# Patient Record
Sex: Female | Born: 1990 | Race: Black or African American | Hispanic: No | Marital: Single | Smoking: Never smoker
Health system: Southern US, Community
[De-identification: ages and names within clinical notes are randomized; demographics above are authoritative.]

## PROBLEM LIST (undated history)

## (undated) ENCOUNTER — Inpatient Hospital Stay (HOSPITAL_COMMUNITY): Payer: Self-pay

## (undated) DIAGNOSIS — B999 Unspecified infectious disease: Secondary | ICD-10-CM

## (undated) DIAGNOSIS — F3181 Bipolar II disorder: Secondary | ICD-10-CM

## (undated) DIAGNOSIS — B009 Herpesviral infection, unspecified: Secondary | ICD-10-CM

## (undated) DIAGNOSIS — F329 Major depressive disorder, single episode, unspecified: Secondary | ICD-10-CM

## (undated) DIAGNOSIS — K219 Gastro-esophageal reflux disease without esophagitis: Secondary | ICD-10-CM

## (undated) DIAGNOSIS — F32A Depression, unspecified: Secondary | ICD-10-CM

---

## 2011-10-17 HISTORY — PX: BUNIONECTOMY: SHX129

## 2014-01-17 ENCOUNTER — Encounter (HOSPITAL_COMMUNITY): Payer: Self-pay | Admitting: Emergency Medicine

## 2014-01-17 ENCOUNTER — Emergency Department (HOSPITAL_COMMUNITY)
Admission: EM | Admit: 2014-01-17 | Discharge: 2014-01-17 | Disposition: A | Discharge status: Home / Self Care | Payer: No Typology Code available for payment source | Attending: Emergency Medicine | Admitting: Emergency Medicine

## 2014-01-17 DIAGNOSIS — Z8659 Personal history of other mental and behavioral disorders: Secondary | ICD-10-CM | POA: Insufficient documentation

## 2014-01-17 DIAGNOSIS — T7421XA Adult sexual abuse, confirmed, initial encounter: Secondary | ICD-10-CM | POA: Insufficient documentation

## 2014-01-17 DIAGNOSIS — Z3202 Encounter for pregnancy test, result negative: Secondary | ICD-10-CM | POA: Insufficient documentation

## 2014-01-17 DIAGNOSIS — K219 Gastro-esophageal reflux disease without esophagitis: Secondary | ICD-10-CM | POA: Insufficient documentation

## 2014-01-17 DIAGNOSIS — IMO0002 Reserved for concepts with insufficient information to code with codable children: Secondary | ICD-10-CM

## 2014-01-17 DIAGNOSIS — Z79899 Other long term (current) drug therapy: Secondary | ICD-10-CM | POA: Insufficient documentation

## 2014-01-17 HISTORY — DX: Bipolar II disorder: F31.81

## 2014-01-17 HISTORY — DX: Gastro-esophageal reflux disease without esophagitis: K21.9

## 2014-01-17 LAB — WET PREP, GENITAL
CLUE CELLS WET PREP: NONE SEEN
Trich, Wet Prep: NONE SEEN
WBC WET PREP: NONE SEEN
YEAST WET PREP: NONE SEEN

## 2014-01-17 LAB — POC URINE PREG, ED: Preg Test, Ur: NEGATIVE

## 2014-01-17 MED ORDER — METRONIDAZOLE 500 MG PO TABS
ORAL_TABLET | ORAL | Status: AC
  Administered 2014-01-17: 05:00:00
  Filled 2014-01-17: qty 4

## 2014-01-17 MED ORDER — PROMETHAZINE HCL 25 MG PO TABS
ORAL_TABLET | ORAL | Status: AC
  Administered 2014-01-17: 05:00:00
  Filled 2014-01-17: qty 3

## 2014-01-17 MED ORDER — CEFIXIME 400 MG PO TABS
ORAL_TABLET | ORAL | Status: AC
  Administered 2014-01-17: 05:00:00
  Filled 2014-01-17: qty 1

## 2014-01-17 MED ORDER — ULIPRISTAL ACETATE 30 MG PO TABS
ORAL_TABLET | ORAL | Status: AC
  Administered 2014-01-17: 05:00:00
  Filled 2014-01-17: qty 1

## 2014-01-17 MED ORDER — AZITHROMYCIN 1 G PO PACK
PACK | ORAL | Status: AC
  Administered 2014-01-17: 05:00:00
  Filled 2014-01-17: qty 1
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
## 2014-01-17 NOTE — ED Notes (Signed)
Bed: WA17 Expected date:  Expected time:  Means of arrival:  Comments: SANE

## 2014-01-17 NOTE — ED Notes (Signed)
Medications were given to patient to take home because she had not eaten anything in several hours.
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
## 2014-01-21 LAB — GC/CHLAMYDIA PROBE AMP
CT Probe RNA: POSITIVE — AB
GC PROBE AMP APTIMA: NEGATIVE

## 2014-01-22 ENCOUNTER — Telehealth (HOSPITAL_BASED_OUTPATIENT_CLINIC_OR_DEPARTMENT_OTHER): Payer: Self-pay | Admitting: Emergency Medicine

## 2014-01-22 NOTE — Telephone Encounter (Signed)
+   Chlamydia , was treated prophylactically in ED on 01/17/14

## 2014-02-26 ENCOUNTER — Emergency Department (HOSPITAL_COMMUNITY)
Admission: EM | Admit: 2014-02-26 | Discharge: 2014-02-26 | Disposition: A | Discharge status: Home / Self Care | Attending: Emergency Medicine | Admitting: Emergency Medicine

## 2014-02-26 ENCOUNTER — Encounter (HOSPITAL_COMMUNITY): Payer: Self-pay | Admitting: *Deleted

## 2014-02-26 DIAGNOSIS — K219 Gastro-esophageal reflux disease without esophagitis: Secondary | ICD-10-CM | POA: Diagnosis not present

## 2014-02-26 DIAGNOSIS — H9203 Otalgia, bilateral: Secondary | ICD-10-CM | POA: Insufficient documentation

## 2014-02-26 DIAGNOSIS — H748X3 Other specified disorders of middle ear and mastoid, bilateral: Secondary | ICD-10-CM | POA: Insufficient documentation

## 2014-02-26 DIAGNOSIS — Z8659 Personal history of other mental and behavioral disorders: Secondary | ICD-10-CM | POA: Insufficient documentation

## 2014-02-26 DIAGNOSIS — Z79899 Other long term (current) drug therapy: Secondary | ICD-10-CM | POA: Insufficient documentation

## 2014-02-26 MED ORDER — NEOMYCIN-POLYMYXIN-HC 3.5-10000-1 OT SUSP
3.0000 [drp] | Freq: Four times a day (QID) | OTIC | Status: DC
  Administered 2014-02-26: 3 [drp] via OTIC
  Filled 2014-02-26: qty 10

## 2014-02-26 NOTE — ED Notes (Signed)

## 2014-02-26 NOTE — Discharge Instructions (Signed)
Ear Drops °You have been diagnosed with a condition requiring you to put drops of medicine into your outer ear. °HOME CARE INSTRUCTIONS  °· Put drops in the affected ear as instructed. After putting the drops in, you will need to lie down with the affected ear facing up for ten minutes so the drops will remain in the ear canal and run down and fill the canal. Continue using the ear drops for as long as directed by your health care provider. °· Prior to getting up, put a cotton ball gently in your ear canal. Leave enough of the cotton ball out so it can be easily removed. Do not attempt to push this down into the canal with a cotton-tipped swab or other instrument. °· Do not irrigate or wash out your ears if you have had a perforated eardrum or mastoid surgery, or unless instructed to do so by your health care provider. °· Keep appointments with your health care provider as instructed. °· Finish all medicine, or use for the length of time prescribed by your health care provider. Continue the drops even if your problem seems to be doing well after a couple days, or continue as instructed. °SEEK MEDICAL CARE IF: °· You become worse or develop increasing pain. °· You notice any unusual drainage from your ear (particularly if the drainage has a bad smell). °· You develop hearing difficulties. °· You experience a serious form of dizziness in which you feel as if the room is spinning, and you feel nauseated (vertigo). °· The outside of your ear becomes red or swollen or both. This may be a sign of an allergic reaction. °MAKE SURE YOU:  °· Understand these instructions. °· Will watch your condition. °· Will get help right away if you are not doing well or get worse. °Document Released: 12/27/2000 Document Revised: 01/07/2013 Document Reviewed: 07/30/2012 °ExitCare® Patient Information ©2015 [employer]. This information is not intended to replace advice given to you by your health care provider. Make sure you discuss any  questions you have with your health care provider. ° °

## 2014-02-26 NOTE — ED Provider Notes (Signed)
CSN: [id number]     Arrival date & time 02/26/14  0154 History   First MD Initiated Contact with Patient 02/26/14 0207     Chief Complaint  Patient presents with  . Otalgia     (Consider location/radiation/quality/duration/timing/severity/associated sxs/prior Treatment) Patient is a 24 y.o. female presenting with ear pain. The history is provided by the patient. No language interpreter was used.  Otalgia Location:  Bilateral Associated symptoms: no congestion, no ear discharge and no fever   Associated symptoms comment:  She started having significant bilateral ear pain shortly after using ear drops to clean ear wax from the ear canals. No fever, and no change in hearing.    Past Medical History  Diagnosis Date  . GERD (gastroesophageal reflux disease)   . Bipolar 2 disorder    Past Surgical History  Procedure Laterality Date  . Bunionectomy Right 10/13   No family history on file. History  Substance Use Topics  . Smoking status: Never Smoker   . Smokeless tobacco: Never Used  . Alcohol Use: Yes     Comment: socially   OB History    No data available     Review of Systems  Constitutional: Negative for fever and chills.  HENT: Positive for ear pain. Negative for congestion and ear discharge.   Neurological: Negative for dizziness.      Allergies  Review of patient's allergies indicates no known allergies.  Home Medications   Prior to Admission medications   Medication Sig Start Date End Date Taking? Authorizing Provider  omeprazole (PRILOSEC) 20 MG capsule Take 20 mg by mouth daily.    Historical Provider, MD   BP 134/63 mmHg  Pulse 60  Temp(Src) 98.3 F (36.8 C) (Oral)  Resp 19  Ht 5\' 4"  (1.626 m)  Wt 180 lb (81.647 kg)  BMI 30.88 kg/m2  SpO2 98%  LMP 02/17/2014 Physical Exam  Constitutional: She appears well-developed and well-nourished. No distress.  HENT:  Head: Normocephalic.  Right Ear: External ear normal.  Left Ear: External ear normal.   TM's appear reddened and  irritated bilaterally with dullness or middle ear effusion.   Lymphadenopathy:    She has no cervical adenopathy.    ED Course  Procedures (including critical care time) Labs Review Labs Reviewed - No data to display  Imaging Review No results found.   EKG Interpretation None      MDM   Final diagnoses:  None    1. Otalgia  Pain is likely secondary to drops used for cleansing the ear. Will provide cortosporin otic and ENT referral if pain continues.     Arnoldo HookerShari A Voshon Petro, PA-C 02/26/14 [id number]  Loren Raceravid Yelverton, MD 02/26/14 [phone]

## 2014-02-26 NOTE — ED Notes (Signed)
Pt reports using debrox ear drops in both ears x 4 hours ago.  Her ears started to hurt right after.  She reports she was able to get cerumen out of her R ear.  Redness noted in her L ear.  Pt reports mild pain in her L ear but worse in her Right.

## 2016-03-31 ENCOUNTER — Other Ambulatory Visit: Payer: Self-pay | Admitting: Orthopedic Surgery

## 2016-03-31 DIAGNOSIS — M25511 Pain in right shoulder: Secondary | ICD-10-CM

## 2016-04-14 ENCOUNTER — Ambulatory Visit
Admission: RE | Admit: 2016-04-14 | Discharge: 2016-04-14 | Disposition: A | Discharge status: Home / Self Care | Source: Ambulatory Visit | Attending: Orthopedic Surgery | Admitting: Orthopedic Surgery

## 2016-04-14 ENCOUNTER — Ambulatory Visit
Admission: RE | Admit: 2016-04-14 | Discharge: 2016-04-14 | Disposition: A | Discharge status: Home / Self Care | Payer: Federal, State, Local not specified - PPO | Source: Ambulatory Visit | Attending: Orthopedic Surgery | Admitting: Orthopedic Surgery

## 2016-04-14 DIAGNOSIS — M25511 Pain in right shoulder: Secondary | ICD-10-CM

## 2016-04-14 MED ORDER — IOPAMIDOL (ISOVUE-M 200) INJECTION 41%: 15.0000 mL | Freq: Once | INTRAMUSCULAR | Status: DC

## 2017-03-06 ENCOUNTER — Encounter (HOSPITAL_COMMUNITY): Payer: Self-pay | Admitting: Emergency Medicine

## 2017-03-06 DIAGNOSIS — R1012 Left upper quadrant pain: Secondary | ICD-10-CM | POA: Insufficient documentation

## 2017-03-06 DIAGNOSIS — R197 Diarrhea, unspecified: Secondary | ICD-10-CM | POA: Diagnosis present

## 2017-03-06 DIAGNOSIS — R110 Nausea: Secondary | ICD-10-CM | POA: Insufficient documentation

## 2017-03-06 LAB — URINALYSIS, ROUTINE W REFLEX MICROSCOPIC
BILIRUBIN URINE: NEGATIVE
Glucose, UA: NEGATIVE mg/dL
Hgb urine dipstick: NEGATIVE
Ketones, ur: 5 mg/dL — AB
Leukocytes, UA: NEGATIVE
NITRITE: NEGATIVE
PROTEIN: NEGATIVE mg/dL
Specific Gravity, Urine: 1.035 — ABNORMAL HIGH (ref 1.005–1.030)
pH: 5 (ref 5.0–8.0)

## 2017-03-06 LAB — COMPREHENSIVE METABOLIC PANEL
ALK PHOS: 59 U/L (ref 38–126)
ALT: 20 U/L (ref 14–54)
AST: 24 U/L (ref 15–41)
Albumin: 3.7 g/dL (ref 3.5–5.0)
Anion gap: 8 (ref 5–15)
BILIRUBIN TOTAL: 0.6 mg/dL (ref 0.3–1.2)
BUN: 10 mg/dL (ref 6–20)
CALCIUM: 9.3 mg/dL (ref 8.9–10.3)
CO2: 24 mmol/L (ref 22–32)
Chloride: 107 mmol/L (ref 101–111)
Creatinine, Ser: 0.92 mg/dL (ref 0.44–1.00)
GFR calc Af Amer: >60 mL/min (ref >60)
GFR calc non Af Amer: >60 mL/min (ref >60)
Glucose, Bld: 97 mg/dL (ref 65–99)
Potassium: 3.7 mmol/L (ref 3.5–5.1)
Sodium: 139 mmol/L (ref 135–145)
TOTAL PROTEIN: 7.1 g/dL (ref 6.5–8.1)

## 2017-03-06 LAB — CBC
HCT: 41.1 % (ref 36.0–46.0)
Hemoglobin: 13.7 g/dL (ref 12.0–15.0)
MCH: 31.2 pg (ref 26.0–34.0)
MCHC: 33.3 g/dL (ref 30.0–36.0)
MCV: 93.6 fL (ref 78.0–100.0)
PLATELETS: 256 10*3/uL (ref 150–400)
RBC: 4.39 MIL/uL (ref 3.87–5.11)
RDW: 12.4 % (ref 11.5–15.5)
WBC: 4.5 10*3/uL (ref 4.0–10.5)

## 2017-03-06 LAB — LIPASE, BLOOD: Lipase: 29 U/L (ref 11–51)

## 2017-03-06 NOTE — ED Triage Notes (Signed)
Pt reports LUQ abd pain with nausea present since Sunday. Pain is intermittent, sharp/cramping.

## 2017-03-07 ENCOUNTER — Emergency Department (HOSPITAL_COMMUNITY): Payer: Federal, State, Local not specified - PPO

## 2017-03-07 ENCOUNTER — Emergency Department (HOSPITAL_COMMUNITY)
Admission: EM | Admit: 2017-03-07 | Discharge: 2017-03-07 | Disposition: A | Discharge status: Home / Self Care | Payer: Federal, State, Local not specified - PPO | Attending: Emergency Medicine | Admitting: Emergency Medicine

## 2017-03-07 DIAGNOSIS — R197 Diarrhea, unspecified: Secondary | ICD-10-CM

## 2017-03-07 DIAGNOSIS — R110 Nausea: Secondary | ICD-10-CM

## 2017-03-07 HISTORY — DX: Herpesviral infection, unspecified: B00.9

## 2017-03-07 LAB — PREGNANCY, URINE: PREG TEST UR: NEGATIVE

## 2017-03-07 MED ORDER — GI COCKTAIL ~~LOC~~
30.0000 mL | Freq: Once | ORAL | Status: AC
  Administered 2017-03-07: 30 mL via ORAL
  Filled 2017-03-07: qty 30

## 2017-03-07 MED ORDER — ONDANSETRON 4 MG PO TBDP: ORAL_TABLET | ORAL | 0 refills | Status: AC

## 2017-03-07 NOTE — ED Notes (Signed)
Sent add on label to main lab for urine preg test.

## 2017-03-07 NOTE — ED Notes (Signed)
Patient transported to X-ray 

## 2017-03-07 NOTE — ED Provider Notes (Signed)
[facility] EMERGENCY DEPARTMENT Provider Note   CSN: [id number] Arrival date & time: 03/06/17  2210     History   Chief Complaint Chief Complaint  Patient presents with  . Abdominal Pain    HPI Jasmine Friedman is a 27 y.o. female.  The history is provided by the patient.  Abdominal Pain   This is a new problem. The current episode started yesterday. The problem occurs constantly. The problem has not changed since onset.The pain is associated with an unknown factor. The pain is located in the LUQ. The quality of the pain is cramping. The pain is moderate. Associated symptoms include diarrhea and nausea. Pertinent negatives include anorexia, fever, belching, flatus, hematochezia, melena, vomiting, constipation, dysuria, frequency, hematuria, headaches, arthralgias and myalgias. Associated symptoms comments: Diarrhea 4 episodes 2 days ago. Nothing aggravates the symptoms. The symptoms are relieved by eating. Past workup does not include GI consult. Her past medical history is significant for GERD. Her past medical history does not include Crohn's disease or irritable bowel syndrome.    Past Medical History:  Diagnosis Date  . Bipolar 2 disorder (HCC)   . GERD (gastroesophageal reflux disease)   . HSV infection     There are no active problems to display for this patient.   Past Surgical History:  Procedure Laterality Date  . BUNIONECTOMY Right 10/13    OB History    No data available       Home Medications    Prior to Admission medications   Medication Sig Start Date End Date Taking? Authorizing Provider  omeprazole (PRILOSEC) 20 MG capsule Take 20 mg by mouth daily.    [provider]    Family History No family history on file.  Social History Social History   Tobacco Use  . Smoking status: Never Smoker  . Smokeless tobacco: Never Used  Substance Use Topics  . Alcohol use: Yes    Comment: socially  . Drug use: No      Allergies   Patient has no known allergies.   Review of Systems Review of Systems  Constitutional: Negative for fever.  Gastrointestinal: Positive for abdominal pain, diarrhea and nausea. Negative for anorexia, constipation, flatus, hematochezia, melena and vomiting.  Genitourinary: Negative for dysuria, frequency and hematuria.  Musculoskeletal: Negative for arthralgias and myalgias.  Neurological: Negative for headaches.  All other systems reviewed and are negative.    Physical Exam Updated Vital Signs BP 113/72   Pulse (!) 57   Temp 98.6 F (37 C) (Oral)   Resp 18   Ht 5\' 4"  (1.626 m)   Wt 74.8 kg (165 lb)   LMP 02/28/2017 Comment: neg preg test  SpO2 99%   BMI 28.32 kg/m   Physical Exam  Constitutional: She is oriented to person, place, and time. She appears well-developed and well-nourished. No distress.  HENT:  Head: Normocephalic and atraumatic.  Mouth/Throat: No oropharyngeal exudate.  Eyes: Conjunctivae are normal. Pupils are equal, round, and reactive to light.  Neck: Normal range of motion. Neck supple.  Cardiovascular: Normal rate, regular rhythm, normal heart sounds and intact distal pulses.  Pulmonary/Chest: Effort normal and breath sounds normal. No stridor. She has no wheezes. She has no rales.  Abdominal: Soft. Bowel sounds are normal. She exhibits no mass. There is no tenderness. There is no rebound and no guarding.  Musculoskeletal: Normal range of motion.  Neurological: She is alert and oriented to person, place, and time. She displays normal reflexes.  Skin: Skin  is warm. Capillary refill takes less than 2 seconds.  Psychiatric: She has a normal mood and affect.     ED Treatments / Results  Labs (all labs ordered are listed, but only abnormal results are displayed) Results for orders placed or performed during the hospital encounter of 03/07/17  Lipase, blood  Result Value Ref Range   Lipase 29 11 - 51 U/L  Comprehensive metabolic  panel  Result Value Ref Range   Sodium 139 135 - 145 mmol/L   Potassium 3.7 3.5 - 5.1 mmol/L   Chloride 107 101 - 111 mmol/L   CO2 24 22 - 32 mmol/L   Glucose, Bld 97 65 - 99 mg/dL   BUN 10 6 - 20 mg/dL   Creatinine, Ser 1.61 0.44 - 1.00 mg/dL   Calcium 9.3 8.9 - 09.6 mg/dL   Total Protein 7.1 6.5 - 8.1 g/dL   Albumin 3.7 3.5 - 5.0 g/dL   AST 24 15 - 41 U/L   ALT 20 14 - 54 U/L   Alkaline Phosphatase 59 38 - 126 U/L   Total Bilirubin 0.6 0.3 - 1.2 mg/dL   GFR calc non Af Amer >60 >60 mL/min   GFR calc Af Amer >60 >60 mL/min   Anion gap 8 5 - 15  CBC  Result Value Ref Range   WBC 4.5 4.0 - 10.5 K/uL   RBC 4.39 3.87 - 5.11 MIL/uL   Hemoglobin 13.7 12.0 - 15.0 g/dL   HCT 04.5 40.9 - 81.1 %   MCV 93.6 78.0 - 100.0 fL   MCH 31.2 26.0 - 34.0 pg   MCHC 33.3 30.0 - 36.0 g/dL   RDW 91.4 78.2 - 95.6 %   Platelets 256 150 - 400 K/uL  Urinalysis, Routine w reflex microscopic  Result Value Ref Range   Color, Urine YELLOW YELLOW   APPearance HAZY (A) CLEAR   Specific Gravity, Urine 1.035 (H) 1.005 - 1.030   pH 5.0 5.0 - 8.0   Glucose, UA NEGATIVE NEGATIVE mg/dL   Hgb urine dipstick NEGATIVE NEGATIVE   Bilirubin Urine NEGATIVE NEGATIVE   Ketones, ur 5 (A) NEGATIVE mg/dL   Protein, ur NEGATIVE NEGATIVE mg/dL   Nitrite NEGATIVE NEGATIVE   Leukocytes, UA NEGATIVE NEGATIVE  Pregnancy, urine  Result Value Ref Range   Preg Test, Ur NEGATIVE NEGATIVE   Dg Abd Acute W/chest  Result Date: 03/07/2017 CLINICAL DATA:  Upper abdominal pain.  Constipation. EXAM: DG ABDOMEN ACUTE W/ 1V CHEST COMPARISON:  None. FINDINGS: The cardiomediastinal contours are normal. The lungs are clear. There is no free intra-abdominal air. No dilated bowel loops to suggest obstruction. Possible bowel wall edema involving the descending colon and small bowel in the left abdomen. No radiopaque calculi. Pelvic phleboliths. No acute osseous abnormalities are seen. IMPRESSION: 1. Possible bowel wall edema involving the  descending colon and left mid abdominal small bowel loop, nonspecific. This can be seen with enteritis/colitis. No obstruction or free air. 2. Clear lungs. Electronically Signed   By: Rubye Oaks M.D.   On: 03/07/2017 05:19    Radiology Dg Abd Acute W/chest  Result Date: 03/07/2017 CLINICAL DATA:  Upper abdominal pain.  Constipation. EXAM: DG ABDOMEN ACUTE W/ 1V CHEST COMPARISON:  None. FINDINGS: The cardiomediastinal contours are normal. The lungs are clear. There is no free intra-abdominal air. No dilated bowel loops to suggest obstruction. Possible bowel wall edema involving the descending colon and small bowel in the left abdomen. No radiopaque calculi. Pelvic phleboliths. No acute  osseous abnormalities are seen. IMPRESSION: 1. Possible bowel wall edema involving the descending colon and left mid abdominal small bowel loop, nonspecific. This can be seen with enteritis/colitis. No obstruction or free air. 2. Clear lungs. Electronically Signed   By: Rubye OaksMelanie  Ehinger M.D.   On: 03/07/2017 05:19    Procedures Procedures (including critical care time)  Medications Ordered in ED Medications  gi cocktail (Maalox,Lidocaine,Donnatal) (30 mLs Oral Given 03/07/17 0442)       Final Clinical Impressions(s) / ED Diagnoses  Abdomen is non tender on exam:  I highly doubt infectious colitis given exam vitals and white count she is markedly improved post medication.    Strict return precautions for fever, diarrhea that is persistent, worsening pain, intractable vomiting.  Return for weakness, numbness, changes in vision or speech,  fevers > 100.4 unrelieved by medication, shortness of breath, intractable vomiting, or diarrhea, abdominal pain, Inability to tolerate liquids or food, cough, altered mental status or any concerns. No signs of systemic illness or infection. The patient is nontoxic-appearing on exam and vital signs are within normal limits.    I have reviewed the triage vital signs and the  nursing notes. Pertinent labs &imaging results that were available during my care of the patient were reviewed by me and considered in my medical decision making (see chart for details).  After history, exam, and medical workup I feel the patient has been appropriately medically screened and is safe for discharge home. Pertinent diagnoses were discussed with the patient. Patient was given return precautions.     Mussa Groesbeck, MD 03/07/17 [phone]

## 2017-03-07 NOTE — Discharge Instructions (Signed)
Stay hydrated.   Take zofran as needed for nausea.   See your doctor.   Return to ER if you have worse abdominal pain, vomiting, dehydration, fever

## 2017-03-07 NOTE — ED Notes (Signed)
Called mini lab, states no dark green to run i-stat hcg. Pregnancy urine added on

## 2018-02-19 LAB — OB RESULTS CONSOLE ANTIBODY SCREEN: Antibody Screen: NEGATIVE

## 2018-02-19 LAB — OB RESULTS CONSOLE HGB/HCT, BLOOD
HCT: 36 (ref 29–41)
Hemoglobin: 12.3

## 2018-02-19 LAB — OB RESULTS CONSOLE RUBELLA ANTIBODY, IGM: Rubella: IMMUNE

## 2018-02-19 LAB — OB RESULTS CONSOLE GC/CHLAMYDIA
CHLAMYDIA, DNA PROBE: NEGATIVE
Gonorrhea: NEGATIVE

## 2018-02-19 LAB — OB RESULTS CONSOLE HEPATITIS B SURFACE ANTIGEN: Hepatitis B Surface Ag: NEGATIVE

## 2018-02-19 LAB — OB RESULTS CONSOLE PLATELET COUNT: Platelets: 281

## 2018-02-19 LAB — HIV ANTIBODY (ROUTINE TESTING W REFLEX): HIV Screen 4th Generation wRfx: NONREACTIVE

## 2018-02-19 LAB — OB RESULTS CONSOLE RPR: RPR: NONREACTIVE

## 2018-02-19 LAB — OB RESULTS CONSOLE ABO/RH: RH Type: POSITIVE

## 2018-02-19 LAB — OB RESULTS CONSOLE HIV ANTIBODY (ROUTINE TESTING): HIV: NONREACTIVE

## 2018-03-29 ENCOUNTER — Other Ambulatory Visit: Payer: Self-pay

## 2018-03-29 ENCOUNTER — Inpatient Hospital Stay (HOSPITAL_COMMUNITY)
Admission: AD | Admit: 2018-03-29 | Discharge: 2018-03-29 | Disposition: A | Discharge status: Home / Self Care | Attending: Obstetrics and Gynecology | Admitting: Obstetrics and Gynecology

## 2018-03-29 ENCOUNTER — Encounter (HOSPITAL_COMMUNITY): Payer: Self-pay

## 2018-03-29 DIAGNOSIS — O26899 Other specified pregnancy related conditions, unspecified trimester: Secondary | ICD-10-CM

## 2018-03-29 DIAGNOSIS — Z3A13 13 weeks gestation of pregnancy: Secondary | ICD-10-CM | POA: Insufficient documentation

## 2018-03-29 DIAGNOSIS — R1031 Right lower quadrant pain: Secondary | ICD-10-CM | POA: Diagnosis present

## 2018-03-29 DIAGNOSIS — R109 Unspecified abdominal pain: Secondary | ICD-10-CM

## 2018-03-29 DIAGNOSIS — O26891 Other specified pregnancy related conditions, first trimester: Secondary | ICD-10-CM | POA: Insufficient documentation

## 2018-03-29 LAB — URINALYSIS, ROUTINE W REFLEX MICROSCOPIC
Bilirubin Urine: NEGATIVE
Glucose, UA: NEGATIVE mg/dL
Hgb urine dipstick: NEGATIVE
Ketones, ur: NEGATIVE mg/dL
Leukocytes,Ua: NEGATIVE
Nitrite: NEGATIVE
PROTEIN: NEGATIVE mg/dL
Specific Gravity, Urine: 1.032 — ABNORMAL HIGH (ref 1.005–1.030)
pH: 6 (ref 5.0–8.0)

## 2018-03-29 LAB — WET PREP, GENITAL
CLUE CELLS WET PREP: NONE SEEN
SPERM: NONE SEEN
Trich, Wet Prep: NONE SEEN
Yeast Wet Prep HPF POC: NONE SEEN

## 2018-03-29 LAB — POCT PREGNANCY, URINE: Preg Test, Ur: POSITIVE — AB

## 2018-03-29 NOTE — MAU Note (Signed)
Jasmine Friedman is a 28 y.o. here in MAU reporting: sharp pain on right abdomen that started about 30 min ago. The pain has since passed. No bleeding, no abnormal discharge. Reports she is [redacted] weeks pregnant.   LMP: 12/27/17  Onset of complaint: 30 min ago  Pain score: 0/10  Vitals:   03/29/18 1852  BP: 120/60  Pulse: (!) 57  Resp: 18  Temp: (!) 97.5 F (36.4 C)  SpO2: 100%      FHT:152  Lab orders placed from triage: UA, UPT

## 2018-03-29 NOTE — Discharge Instructions (Signed)
Abdominal Pain During Pregnancy ° °Abdominal pain is common during pregnancy, and has many possible causes. Some causes are more serious than others, and sometimes the cause is not known. Abdominal pain can be a sign that labor is starting. It can also be caused by normal growth and stretching of muscles and ligaments during pregnancy. Always tell your health care provider if you have any abdominal pain. °Follow these instructions at home: °· Do not have sex or put anything in your vagina until your pain goes away completely. °· Get plenty of rest until your pain improves. °· Drink enough fluid to keep your urine pale yellow. °· Take over-the-counter and prescription medicines only as told by your health care provider. °· Keep all follow-up visits as told by your health care provider. This is important. °Contact a health care provider if: °· Your pain continues or gets worse after resting. °· You have lower abdominal pain that: °? Comes and goes at regular intervals. °? Spreads to your back. °? Is similar to menstrual cramps. °· You have pain or burning when you urinate. °Get help right away if: °· You have a fever or chills. °· You have vaginal bleeding. °· You are leaking fluid from your vagina. °· You are passing tissue from your vagina. °· You have vomiting or diarrhea that lasts for more than 24 hours. °· Your baby is moving less than usual. °· You feel very weak or faint. °· You have shortness of breath. °· You develop severe pain in your upper abdomen. °Summary °· Abdominal pain is common during pregnancy, and has many possible causes. °· If you experience abdominal pain during pregnancy, tell your health care provider right away. °· Follow your health care provider's home care instructions and keep all follow-up visits as directed. °This information is not intended to replace advice given to you by your health care provider. Make sure you discuss any questions you have with your health care  provider. °Document Released: 01/02/2005 Document Revised: 04/06/2016 Document Reviewed: 04/06/2016 °Elsevier Interactive Patient Education © 2019 [employer] ° °

## 2018-03-29 NOTE — MAU Provider Note (Signed)
History     CSN: [id number]  Arrival date and time: 03/29/18 5364   First Provider Initiated Contact with Patient 03/29/18 1921      Chief Complaint  Patient presents with  . Abdominal Pain   G2P0010 @13 .1 wks presenting with RLQ pain. Reports cramping in RLQ about 1 hr ago. Pain lasted about 8 min. No pain now. No VB or discharge. No urinary sx. No GI sx. No fevers. Her pregnancy has been uncomplicated, reports normal [place].    OB History    Gravida  2   Para      Term      Preterm      AB  1   Living  0     SAB      TAB      Ectopic      Multiple      Live Births  0           Past Medical History:  Diagnosis Date  . Bipolar 2 disorder (HCC)    missed diagnosis pt states should be PTSD  . GERD (gastroesophageal reflux disease)   . HSV infection     Past Surgical History:  Procedure Laterality Date  . BUNIONECTOMY Right 10/13    History reviewed. No pertinent family history.  Social History   Tobacco Use  . Smoking status: Never Smoker  . Smokeless tobacco: Never Used  Substance Use Topics  . Alcohol use: Not Currently    Comment: socially  . Drug use: No    Allergies:  Allergies  Allergen Reactions  . Zoloft [Sertraline Hcl]     tremors     No medications prior to admission.    Review of Systems  Constitutional: Negative for chills and fever.  Gastrointestinal: Positive for abdominal pain. Negative for constipation, diarrhea, nausea and vomiting.  Genitourinary: Negative for dysuria, frequency, urgency, vaginal bleeding and vaginal discharge.   Physical Exam   Blood pressure (!) 107/58, pulse (!) 59, temperature 98.5 F (36.9 C), temperature source Oral, resp. rate 17, height 5\' 4"  (1.626 m), weight 82.1 kg, last menstrual period 12/27/2017, SpO2 100 %.  Physical Exam  Nursing note and vitals reviewed. Constitutional: She is oriented to person, place, and time. She appears well-developed and well-nourished. No distress.   HENT:  Head: Normocephalic and atraumatic.  Neck: Normal range of motion.  Respiratory: Effort normal. No respiratory distress.  GI: Soft. She exhibits no distension and no mass. There is no abdominal tenderness. There is no rebound and no guarding.  Genitourinary:    Genitourinary Comments: SVE closed/long   Musculoskeletal: Normal range of motion.  Neurological: She is alert and oriented to person, place, and time.  Skin: Skin is warm and dry.  Psychiatric: She has a normal mood and affect.  FHT 152  Results for orders placed or performed during the hospital encounter of 03/29/18 (from the past 24 hour(s))  Urinalysis, Routine w reflex microscopic     Status: Abnormal   Collection Time: 03/29/18  6:42 PM  Result Value Ref Range   Color, Urine YELLOW YELLOW   APPearance HAZY (A) CLEAR   Specific Gravity, Urine 1.032 (H) 1.005 - 1.030   pH 6.0 5.0 - 8.0   Glucose, UA NEGATIVE NEGATIVE mg/dL   Hgb urine dipstick NEGATIVE NEGATIVE   Bilirubin Urine NEGATIVE NEGATIVE   Ketones, ur NEGATIVE NEGATIVE mg/dL   Protein, ur NEGATIVE NEGATIVE mg/dL   Nitrite NEGATIVE NEGATIVE   Leukocytes,Ua NEGATIVE NEGATIVE  Pregnancy,  urine POC     Status: Abnormal   Collection Time: 03/29/18  6:51 PM  Result Value Ref Range   Preg Test, Ur POSITIVE (A) NEGATIVE  Wet prep, genital     Status: Abnormal   Collection Time: 03/29/18  7:34 PM  Result Value Ref Range   Yeast Wet Prep HPF POC NONE SEEN NONE SEEN   Trich, Wet Prep NONE SEEN NONE SEEN   Clue Cells Wet Prep HPF POC NONE SEEN NONE SEEN   WBC, Wet Prep HPF POC FEW (A) NONE SEEN   Sperm NONE SEEN    MAU Course  Procedures Orders Placed This Encounter  Procedures  . Wet prep, genital    Standing Status:   Standing    Number of Occurrences:   1  . Urinalysis, Routine w reflex microscopic    Standing Status:   Standing    Number of Occurrences:   1  . Pregnancy, urine POC    Standing Status:   Standing    Number of Occurrences:   1   . Discharge patient    Order Specific Question:   Discharge disposition    Answer:   01-Home or Self Care [1]    Order Specific Question:   Discharge patient date    Answer:   03/29/2018   MDM Labs ordered and reviewed. No evidence of UTI, pelvic infection, or imminent SAB. Pt reassured. Stable for discharge home.   Assessment and Plan   1. [redacted] weeks gestation of pregnancy   2. Abdominal cramping affecting pregnancy    Discharge home [facility] as scheduled SAB precautions  Allergies as of 03/29/2018      Reactions   Zoloft [sertraline Hcl]    tremors       Medication List    TAKE these medications   omeprazole 20 MG capsule Commonly known as:  PRILOSEC Take 20 mg by mouth daily.   ondansetron 4 MG disintegrating tablet Commonly known as:  Zofran ODT 4mg  ODT q6 hours prn nausea/vomit   prenatal multivitamin Tabs tablet Take 1 tablet by mouth daily at 12 noon.   valACYclovir 500 MG tablet Commonly known as:  VALTREX Take 500 mg by mouth 2 (two) times daily.      Donette Larry, CNM 03/29/2018, 8:22 PM

## 2018-03-30 ENCOUNTER — Other Ambulatory Visit: Payer: Self-pay

## 2018-03-30 ENCOUNTER — Inpatient Hospital Stay (HOSPITAL_COMMUNITY)
Admission: AD | Admit: 2018-03-30 | Discharge: 2018-03-30 | Disposition: A | Discharge status: Home / Self Care | Attending: Obstetrics and Gynecology | Admitting: Obstetrics and Gynecology

## 2018-03-30 DIAGNOSIS — Z3A13 13 weeks gestation of pregnancy: Secondary | ICD-10-CM | POA: Diagnosis not present

## 2018-03-30 DIAGNOSIS — O209 Hemorrhage in early pregnancy, unspecified: Secondary | ICD-10-CM | POA: Insufficient documentation

## 2018-03-30 DIAGNOSIS — N939 Abnormal uterine and vaginal bleeding, unspecified: Secondary | ICD-10-CM

## 2018-03-30 NOTE — Progress Notes (Signed)
Leafy Kindle CNM discussed d/c plan and f/u with pt. Pt d/c from Triage

## 2018-03-30 NOTE — MAU Provider Note (Signed)
Chief Complaint: Vaginal Bleeding   None    SUBJECTIVE HPI: Jasmine Friedman is a 28 y.o. G2P0010 at [redacted]w[redacted]d who presents to Maternity Admissions reporting vaginal bleeding.  She called last night ~2300 after being discharged from MAU for cramping and reported spotting. She showed me a picture with some light spotting that was consistent with bleeding post SVE. We decided she would put on a pad, keep an eye on the bleeding and send me pics if she wanted input, come in if she was worried, or sleep if she was satisfied it had resolved.  Around 0530 she sent more pics with bleeding and was tearful on the phone. Bleeding had increased and pt has [facility] in [place] ay 0800. We decided she would come into MAU, I would meet her there, listen to [facility], and then she would leave for [facility] if reassuring.      Past Medical History:  Diagnosis Date  . Bipolar 2 disorder (HCC)    missed diagnosis pt states should be PTSD  . GERD (gastroesophageal reflux disease)   . HSV infection    OB History  Gravida Para Term Preterm AB Living  2       1 0  SAB TAB Ectopic Multiple Live Births          0    # Outcome Date GA Lbr Len/2nd Weight Sex Delivery Anes PTL Lv  2 Current           1 AB            Past Surgical History:  Procedure Laterality Date  . BUNIONECTOMY Right 10/13   Social History   Socioeconomic History  . Marital status: Single    Spouse name: Not on file  . Number of children: Not on file  . Years of education: Not on file  . Highest education level: Not on file  Occupational History  . Not on file  Social Needs  . Financial resource strain: Not on file  . Food insecurity:    Worry: Not on file    Inability: Not on file  . Transportation needs:    Medical: Not on file    Non-medical: Not on file  Tobacco Use  . Smoking status: Never Smoker  . Smokeless tobacco: Never Used  Substance and Sexual Activity  . Alcohol use: Not Currently    Comment: socially  . Drug use: No  .  Sexual activity: Yes  Lifestyle  . Physical activity:    Days per week: Not on file    Minutes per session: Not on file  . Stress: Not on file  Relationships  . Social connections:    Talks on phone: Not on file    Gets together: Not on file    Attends religious service: Not on file    Active member of club or organization: Not on file    Attends meetings of clubs or organizations: Not on file    Relationship status: Not on file  . Intimate partner violence:    Fear of current or ex partner: Not on file    Emotionally abused: Not on file    Physically abused: Not on file    Forced sexual activity: Not on file  Other Topics Concern  . Not on file  Social History Narrative  . Not on file   No family history on file. No current facility-administered medications on file prior to encounter.    Current Outpatient Medications on File Prior to  Encounter  Medication Sig Dispense Refill  . omeprazole (PRILOSEC) 20 MG capsule Take 20 mg by mouth daily.    . ondansetron (ZOFRAN ODT) 4 MG disintegrating tablet  ODT q6 hours prn nausea/vomit 10 tablet 0  . Prenatal Vit-Fe Fumarate-FA (PRENATAL MULTIVITAMIN) TABS tablet Take 1 tablet by mouth daily at 12 noon.    . valACYclovir (VALTREX) 500 MG tablet Take 500 mg by mouth 2 (two) times daily.     Allergies  Allergen Reactions  . Zoloft [Sertraline Hcl]     tremors     I have reviewed patient's Past Medical Hx, Surgical Hx, Family Hx, Social Hx, medications and allergies.   Review of Systems   OBJECTIVE Patient Vitals for the past 24 hrs:  Temp Resp  03/30/18 0608 98.1 F (36.7 C) -  03/30/18 0606 - 18   Constitutional: Well-developed, well-nourished female in no acute distress.  Cardiovascular: normal rate Respiratory: normal rate and effort.  GI: Abd soft, non-tender, gravid appropriate for gestational age.  MS: Extremities nontender, no edema, normal ROM Neurologic: Alert and oriented x 4.  GU: SPECULUM EXAM: deferred;  BIMANUAL: deferred  LAB RESULTS Results for orders placed or performed during the hospital encounter of 03/29/18 (from the past 24 hour(s))  Urinalysis, Routine w reflex microscopic     Status: Abnormal   Collection Time: 03/29/18  6:42 PM  Result Value Ref Range   Color, Urine YELLOW YELLOW   APPearance HAZY (A) CLEAR   Specific Gravity, Urine 1.032 (H) 1.005 - 1.030   pH 6.0 5.0 - 8.0   Glucose, UA NEGATIVE NEGATIVE mg/dL   Hgb urine dipstick NEGATIVE NEGATIVE   Bilirubin Urine NEGATIVE NEGATIVE   Ketones, ur NEGATIVE NEGATIVE mg/dL   Protein, ur NEGATIVE NEGATIVE mg/dL   Nitrite NEGATIVE NEGATIVE   Leukocytes,Ua NEGATIVE NEGATIVE  Pregnancy, urine POC     Status: Abnormal   Collection Time: 03/29/18  6:51 PM  Result Value Ref Range   Preg Test, Ur POSITIVE (A) NEGATIVE  Wet prep, genital     Status: Abnormal   Collection Time: 03/29/18  7:34 PM  Result Value Ref Range   Yeast Wet Prep HPF POC NONE SEEN NONE SEEN   Trich, Wet Prep NONE SEEN NONE SEEN   Clue Cells Wet Prep HPF POC NONE SEEN NONE SEEN   WBC, Wet Prep HPF POC FEW (A) NONE SEEN   Sperm NONE SEEN     IMAGING No results found.  MAU COURSE Orders Placed This Encounter  Procedures  . PRETERM LABOR:  Includes any of the follwing symptoms that occur between 20 - [redacted] weeks gestation.  If these symptoms are not stopped, preterm labor can result in preterm delivery, placing your baby at risk  . Notify physician for menstrual like cramps  . Notify physician for uterine contractions.  These may be painless and feel like the uterus is tightening or the baby is  "balling up"  . Notify physician for low, dull backache, unrelieved by heat or Tylenol  . Notify physician for intestinal cramps, with or without diarrhea, sometimes described as "gas pain"  . Notify physician for pelvic pressure  . Notify physician for increase or change in vaginal discharge  . Notify physician for vaginal bleeding  . Notify physician for a  general feeling that "something is not right"  . Notify physician for leaking of fluid  . Fetal Kick Count:  Lie on our left side for one hour after a meal, and count the  number of times your baby kicks.  If it is less than 5 times, get up, move around and drink some juice.  Repeat the test 30 minutes later.  If it is still less than 5 kicks in an hour, notify your doctor.  Marland Kitchen Discharge patient Discharge disposition: 01-Home or Self Care; Discharge patient date: 03/30/2018   No orders of the defined types were placed in this encounter.   MDM: Already worked up earlier in the evening for threatened miscarriage/VB: GC/CT UA Wet prep  Pt states she had [facility] on early [place]. We discussed reasons for bleeding in early pregnancy. She will return if bleeding becomes heavy or fever develops.    ASSESSMENT 1. Vaginal bleeding   2. Reassuring FHT  PLAN Discharge home in stable condition. SAB precautions  Allergies as of 03/30/2018      Reactions   Zoloft [sertraline Hcl]    tremors       Medication List    TAKE these medications   omeprazole 20 MG capsule Commonly known as:  PRILOSEC Take 20 mg by mouth daily.   ondansetron 4 MG disintegrating tablet Commonly known as:  Zofran ODT 4mg  ODT q6 hours prn nausea/vomit   prenatal multivitamin Tabs tablet Take 1 tablet by mouth daily at 12 noon.   valACYclovir 500 MG tablet Commonly known as:  VALTREX Take 500 mg by mouth 2 (two) times daily.        Garret Reddish, CNM 03/30/2018  6:18 AM

## 2018-03-30 NOTE — MAU Note (Addendum)
Was seen here last night with vag bleeding and cramping. Having more bleeding this am but no pain. Pt is taking [facility] this am in [place]. Leafy Kindle CNM in Triage with pt and discussed plan of care. Pt d/c from Triage so she can get on road to [facility] after hearing FHTS.

## 2018-03-31 ENCOUNTER — Other Ambulatory Visit: Payer: Self-pay

## 2018-03-31 ENCOUNTER — Inpatient Hospital Stay (HOSPITAL_COMMUNITY)

## 2018-03-31 ENCOUNTER — Inpatient Hospital Stay (HOSPITAL_COMMUNITY)
Admission: AD | Admit: 2018-03-31 | Discharge: 2018-03-31 | Disposition: A | Discharge status: Home / Self Care | Attending: Obstetrics and Gynecology | Admitting: Obstetrics and Gynecology

## 2018-03-31 ENCOUNTER — Encounter (HOSPITAL_COMMUNITY): Payer: Self-pay | Admitting: *Deleted

## 2018-03-31 DIAGNOSIS — O4690 Antepartum hemorrhage, unspecified, unspecified trimester: Secondary | ICD-10-CM

## 2018-03-31 DIAGNOSIS — O208 Other hemorrhage in early pregnancy: Secondary | ICD-10-CM | POA: Insufficient documentation

## 2018-03-31 DIAGNOSIS — O99611 Diseases of the digestive system complicating pregnancy, first trimester: Secondary | ICD-10-CM | POA: Diagnosis not present

## 2018-03-31 DIAGNOSIS — O418X20 Other specified disorders of amniotic fluid and membranes, second trimester, not applicable or unspecified: Secondary | ICD-10-CM

## 2018-03-31 DIAGNOSIS — K219 Gastro-esophageal reflux disease without esophagitis: Secondary | ICD-10-CM | POA: Insufficient documentation

## 2018-03-31 DIAGNOSIS — Z3A13 13 weeks gestation of pregnancy: Secondary | ICD-10-CM | POA: Insufficient documentation

## 2018-03-31 DIAGNOSIS — O468X2 Other antepartum hemorrhage, second trimester: Secondary | ICD-10-CM

## 2018-03-31 HISTORY — DX: Unspecified infectious disease: B99.9

## 2018-03-31 HISTORY — DX: Major depressive disorder, single episode, unspecified: F32.9

## 2018-03-31 HISTORY — DX: Depression, unspecified: F32.A

## 2018-03-31 LAB — CBC WITH DIFFERENTIAL/PLATELET
Abs Immature Granulocytes: 0.03 10*3/uL (ref 0.00–0.07)
Basophils Absolute: 0 10*3/uL (ref 0.0–0.1)
Basophils Relative: 0 %
EOS PCT: 1 %
Eosinophils Absolute: 0.1 10*3/uL (ref 0.0–0.5)
HCT: 36.8 % (ref 36.0–46.0)
Hemoglobin: 12 g/dL (ref 12.0–15.0)
Immature Granulocytes: 0 %
Lymphocytes Relative: 18 %
Lymphs Abs: 1.4 10*3/uL (ref 0.7–4.0)
MCH: 29.9 pg (ref 26.0–34.0)
MCHC: 32.6 g/dL (ref 30.0–36.0)
MCV: 91.8 fL (ref 80.0–100.0)
Monocytes Absolute: 0.5 10*3/uL (ref 0.1–1.0)
Monocytes Relative: 7 %
Neutro Abs: 5.9 10*3/uL (ref 1.7–7.7)
Neutrophils Relative %: 74 %
Platelets: 253 10*3/uL (ref 150–400)
RBC: 4.01 MIL/uL (ref 3.87–5.11)
RDW: 11.8 % (ref 11.5–15.5)
WBC: 8 10*3/uL (ref 4.0–10.5)
nRBC: 0 % (ref 0.0–0.2)

## 2018-03-31 NOTE — MAU Note (Signed)
Got up today, went to the bathroom, noted bleeding again. Nothing on pad.  Went again prior to leaving, noted on tissue only.  Reports Subchorionic hemorrhage noted on early [place].

## 2018-03-31 NOTE — Discharge Instructions (Signed)

## 2018-03-31 NOTE — MAU Provider Note (Signed)
History     CSN: [id number]  Arrival date and time: 03/31/18 1779   First Provider Initiated Contact with Patient 03/31/18 0935      Chief Complaint  Patient presents with  . Vaginal Bleeding   Patient seen 3/13 for vaginal bleeding and cramping. Patient had continued vaginal bleeding yesterday morning but no pain. This morning, she also noted vaginal bleeding when she went to the bathroom. She reported frank blood in the toilet and also on the toilet paper when she wiped. She denies cramping this morning. On arrival to the MAU, she reported no further bleeding after the initial episode this morning. She reports she was told she had a subchorionic hemorrhage by Dr. Mora Appl at her last appointment but ultrasound documentation from the office indicates that no subchorionic hemorrhage was seen. Patient denies recent intercourse.    OB History    Gravida  2   Para      Term      Preterm      AB  1   Living  0     SAB      TAB      Ectopic      Multiple      Live Births  0           Past Medical History:  Diagnosis Date  . Bipolar 2 disorder (HCC)    missed diagnosis pt states should be PTSD  . GERD (gastroesophageal reflux disease)   . HSV infection     Past Surgical History:  Procedure Laterality Date  . BUNIONECTOMY Right 10/13    History reviewed. No pertinent family history.  Social History   Tobacco Use  . Smoking status: Never Smoker  . Smokeless tobacco: Never Used  Substance Use Topics  . Alcohol use: Not Currently    Comment: socially  . Drug use: No    Allergies:  Allergies  Allergen Reactions  . Zoloft [Sertraline Hcl]     tremors     Medications Prior to Admission  Medication Sig Dispense Refill Last Dose  . omeprazole (PRILOSEC) 20 MG capsule Take 20 mg by mouth daily.   More than a month at Unknown time  . ondansetron (ZOFRAN ODT) 4 MG disintegrating tablet 4mg  ODT q6 hours prn nausea/vomit 10 tablet 0 More than a month at  Unknown time  . Prenatal Vit-Fe Fumarate-FA (PRENATAL MULTIVITAMIN) TABS tablet Take 1 tablet by mouth daily at 12 noon.   03/29/2018 at Unknown time  . valACYclovir (VALTREX) 500 MG tablet Take 500 mg by mouth 2 (two) times daily.   More than a month at Unknown time    Review of Systems  Gastrointestinal: Negative for abdominal pain.  Genitourinary: Positive for vaginal bleeding. Negative for pelvic pain.  All other systems reviewed and are negative.  Physical Exam   Blood pressure 126/63, pulse 76, temperature 98.9 F (37.2 C), temperature source Oral, resp. rate 18, weight 83 kg, last menstrual period 12/27/2017, SpO2 99 %.  Physical Exam  Nursing note and vitals reviewed. Constitutional: She is oriented to person, place, and time. She appears well-developed and well-nourished.  HENT:  Head: Normocephalic and atraumatic.  Eyes: Pupils are equal, round, and reactive to light.  Cardiovascular: Normal rate, regular rhythm and normal heart sounds.  Respiratory: Effort normal and breath sounds normal. No respiratory distress.  GI: Soft. There is no abdominal tenderness.  Genitourinary:    Uterus normal.     Vaginal bleeding present.  There is bleeding  in the vagina.  Musculoskeletal: Normal range of motion.  Neurological: She is alert and oriented to person, place, and time.  Skin: Skin is warm and dry.  Psychiatric: She has a normal mood and affect. Her behavior is normal. Judgment and thought content normal.    MAU Course  Procedures Ultrasound CBC Doppler FHTs  MDM Wet prep and UA completed 3/13, negative. Patient had negative GC/CT test in office at missed period appointment. Small amount of dark vaginal bleeding was noted in the vagina, no bleeding actively noted coming through os. Patient reports no bleeding on her pad. Ultrasound showed small subchorionic hemorrhage consistent with patient's story from the office. Consulted Dr. Su Hilt: as patient is not actively bleeding  and is not cramping, she will be discharged home in stable condition with subchorionic hemorrhage precautions. Patient to follow up with [facility] in the office in 1 week. Subchorionic hemorrhage precautions discussed and handout given.   Assessment and Plan  28 y.o. G2P0 at [redacted]w[redacted]d [facility] with audible heartbeat  Subchorionic hemorrhage Subchorionic hemorrhage precautions discussed and handout given Follow up in 1 week at [facility] office Discharged home in stable condition    Jasmine Friedman 03/31/2018, 9:43 AM

## 2018-04-01 LAB — GC/CHLAMYDIA PROBE AMP (~~LOC~~) NOT AT ARMC
Chlamydia: NEGATIVE
Neisseria Gonorrhea: NEGATIVE

## 2018-04-16 ENCOUNTER — Encounter: Payer: Self-pay | Admitting: Obstetrics and Gynecology

## 2018-04-16 DIAGNOSIS — Z3480 Encounter for supervision of other normal pregnancy, unspecified trimester: Secondary | ICD-10-CM | POA: Insufficient documentation

## 2018-04-17 ENCOUNTER — Other Ambulatory Visit: Payer: Self-pay

## 2018-04-17 ENCOUNTER — Encounter: Payer: Self-pay | Admitting: Obstetrics and Gynecology

## 2018-04-17 ENCOUNTER — Ambulatory Visit (INDEPENDENT_AMBULATORY_CARE_PROVIDER_SITE_OTHER): Admitting: Obstetrics and Gynecology

## 2018-04-17 DIAGNOSIS — Z3480 Encounter for supervision of other normal pregnancy, unspecified trimester: Secondary | ICD-10-CM

## 2018-04-17 DIAGNOSIS — Z3A15 15 weeks gestation of pregnancy: Secondary | ICD-10-CM

## 2018-04-17 DIAGNOSIS — Z3482 Encounter for supervision of other normal pregnancy, second trimester: Secondary | ICD-10-CM

## 2018-04-17 DIAGNOSIS — Z8619 Personal history of other infectious and parasitic diseases: Secondary | ICD-10-CM

## 2018-04-17 DIAGNOSIS — O99210 Obesity complicating pregnancy, unspecified trimester: Secondary | ICD-10-CM | POA: Insufficient documentation

## 2018-04-17 NOTE — Patient Instructions (Signed)
° °Second Trimester of Pregnancy °The second trimester is from week 14 through week 27 (months 4 through 6). The second trimester is often a time when you feel your best. Your body has adjusted to being pregnant, and you begin to feel better physically. Usually, morning sickness has lessened or quit completely, you may have more energy, and you may have an increase in appetite. The second trimester is also a time when the fetus is growing rapidly. At the end of the sixth month, the fetus is about 9 inches long and weighs about 1½ pounds. You will likely begin to feel the baby move (quickening) between 16 and 20 weeks of pregnancy. °Body changes during your second trimester °Your body continues to go through many changes during your second trimester. The changes vary from woman to woman. °· Your weight will continue to increase. You will notice your lower abdomen bulging out. °· You may begin to get stretch marks on your hips, abdomen, and breasts. °· You may develop headaches that can be relieved by medicines. The medicines should be approved by your health care provider. °· You may urinate more often because the fetus is pressing on your bladder. °· You may develop or continue to have heartburn as a result of your pregnancy. °· You may develop constipation because certain hormones are causing the muscles that push waste through your intestines to slow down. °· You may develop hemorrhoids or swollen, bulging veins (varicose veins). °· You may have back pain. This is caused by: °? Weight gain. °? Pregnancy hormones that are relaxing the joints in your pelvis. °? A shift in weight and the muscles that support your balance. °· Your breasts will continue to grow and they will continue to become tender. °· Your gums may bleed and may be sensitive to brushing and flossing. °· Dark spots or blotches (chloasma, mask of pregnancy) may develop on your face. This will likely fade after the baby is born. °· A dark line from  your belly button to the pubic area (linea nigra) may appear. This will likely fade after the baby is born. °· You may have changes in your hair. These can include thickening of your hair, rapid growth, and changes in texture. Some women also have hair loss during or after pregnancy, or hair that feels dry or thin. Your hair will most likely return to normal after your baby is born. °What to expect at prenatal visits °During a routine prenatal visit: °· You will be weighed to make sure you and the fetus are growing normally. °· Your blood pressure will be taken. °· Your abdomen will be measured to track your baby's growth. °· The fetal heartbeat will be listened to. °· Any test results from the previous visit will be discussed. °Your health care provider may ask you: °· How you are feeling. °· If you are feeling the baby move. °· If you have had any abnormal symptoms, such as leaking fluid, bleeding, severe headaches, or abdominal cramping. °· If you are using any tobacco products, including cigarettes, chewing tobacco, and electronic cigarettes. °· If you have any questions. °Other tests that may be performed during your second trimester include: °· Blood tests that check for: °? Low iron levels (anemia). °? High blood sugar that affects pregnant women (gestational diabetes) between 24 and 28 weeks. °? Rh antibodies. This is to check for a protein on red blood cells (Rh factor). °· Urine tests to check for infections, diabetes, or protein in   the urine. °· An ultrasound to confirm the proper growth and development of the baby. °· An amniocentesis to check for possible genetic problems. °· Fetal screens for spina bifida and Down syndrome. °· HIV (human immunodeficiency virus) testing. Routine prenatal testing includes screening for HIV, unless you choose not to have this test. °Follow these instructions at home: °Medicines °· Follow your health care provider's instructions regarding medicine use. Specific medicines  may be either safe or unsafe to take during pregnancy. °· Take a prenatal vitamin that contains at least 600 micrograms (mcg) of folic acid. °· If you develop constipation, try taking a stool softener if your health care provider approves. °Eating and drinking ° °· Eat a balanced diet that includes fresh fruits and vegetables, whole grains, good sources of protein such as meat, eggs, or tofu, and low-fat dairy. Your health care provider will help you determine the amount of weight gain that is right for you. °· Avoid raw meat and uncooked cheese. These carry germs that can cause birth defects in the baby. °· If you have low calcium intake from food, talk to your health care provider about whether you should take a daily calcium supplement. °· Limit foods that are high in fat and processed sugars, such as fried and sweet foods. °· To prevent constipation: °? Drink enough fluid to keep your urine clear or pale yellow. °? Eat foods that are high in fiber, such as fresh fruits and vegetables, whole grains, and beans. °Activity °· Exercise only as directed by your health care provider. Most women can continue their usual exercise routine during pregnancy. Try to exercise for 30 minutes at least 5 days a week. Stop exercising if you experience uterine contractions. °· Avoid heavy lifting, wear low heel shoes, and practice good posture. °· A sexual relationship may be continued unless your health care provider directs you otherwise. °Relieving pain and discomfort °· Wear a good support bra to prevent discomfort from breast tenderness. °· Take warm sitz baths to soothe any pain or discomfort caused by hemorrhoids. Use hemorrhoid cream if your health care provider approves. °· Rest with your legs elevated if you have leg cramps or low back pain. °· If you develop varicose veins, wear support hose. Elevate your feet for 15 minutes, 3-4 times a day. Limit salt in your diet. °Prenatal Care °· Write down your questions. Take  them to your prenatal visits. °· Keep all your prenatal visits as told by your health care provider. This is important. °Safety °· Wear your seat belt at all times when driving. °· Make a list of emergency phone numbers, including numbers for family, friends, the hospital, and police and fire departments. °General instructions °· Ask your health care provider for a referral to a local prenatal education class. Begin classes no later than the beginning of month 6 of your pregnancy. °· Ask for help if you have counseling or nutritional needs during pregnancy. Your health care provider can offer advice or refer you to specialists for help with various needs. °· Do not use hot tubs, steam rooms, or saunas. °· Do not douche or use tampons or scented sanitary pads. °· Do not cross your legs for long periods of time. °· Avoid cat litter boxes and soil used by cats. These carry germs that can cause birth defects in the baby and possibly loss of the fetus by miscarriage or stillbirth. °· Avoid all smoking, herbs, alcohol, and unprescribed drugs. Chemicals in these products can affect the   formation and growth of the baby. °· Do not use any products that contain nicotine or tobacco, such as cigarettes and e-cigarettes. If you need help quitting, ask your health care provider. °· Visit your dentist if you have not gone yet during your pregnancy. Use a soft toothbrush to brush your teeth and be gentle when you floss. °Contact a health care provider if: °· You have dizziness. °· You have mild pelvic cramps, pelvic pressure, or nagging pain in the abdominal area. °· You have persistent nausea, vomiting, or diarrhea. °· You have a bad smelling vaginal discharge. °· You have pain when you urinate. °Get help right away if: °· You have a fever. °· You are leaking fluid from your vagina. °· You have spotting or bleeding from your vagina. °· You have severe abdominal cramping or pain. °· You have rapid weight gain or weight loss. °· You  have shortness of breath with chest pain. °· You notice sudden or extreme swelling of your face, hands, ankles, feet, or legs. °· You have not felt your baby move in over an hour. °· You have severe headaches that do not go away when you take medicine. °· You have vision changes. °Summary °· The second trimester is from week 14 through week 27 (months 4 through 6). It is also a time when the fetus is growing rapidly. °· Your body goes through many changes during pregnancy. The changes vary from woman to woman. °· Avoid all smoking, herbs, alcohol, and unprescribed drugs. These chemicals affect the formation and growth your baby. °· Do not use any tobacco products, such as cigarettes, chewing tobacco, and e-cigarettes. If you need help quitting, ask your health care provider. °· Contact your health care provider if you have any questions. Keep all prenatal visits as told by your health care provider. This is important. °This information is not intended to replace advice given to you by your health care provider. Make sure you discuss any questions you have with your health care provider. °Document Released: 12/27/2000 Document Revised: 02/08/2016 Document Reviewed: 02/08/2016 °[employer] Interactive Patient Education © 2019 [employer] ° ° °Contraception Choices °Contraception, also called birth control, refers to methods or devices that prevent pregnancy. °Hormonal methods °Contraceptive implant ° °A contraceptive implant is a thin, plastic tube that contains a hormone. It is inserted into the upper part of the arm. It can remain in place for up to 3 years. °Progestin-only injections °Progestin-only injections are injections of progestin, a synthetic form of the hormone progesterone. They are given every 3 months by a health care provider. °Birth control pills ° °Birth control pills are pills that contain hormones that prevent pregnancy. They must be taken once a day, preferably at the same time each day. °Birth  control patch ° °The birth control patch contains hormones that prevent pregnancy. It is placed on the skin and must be changed once a week for three weeks and removed on the fourth week. A prescription is needed to use this method of contraception. °Vaginal ring ° °A vaginal ring contains hormones that prevent pregnancy. It is placed in the vagina for three weeks and removed on the fourth week. After that, the process is repeated with a new ring. A prescription is needed to use this method of contraception. °Emergency contraceptive °Emergency contraceptives prevent pregnancy after unprotected sex. They come in pill form and can be taken up to 5 days after sex. They work best the sooner they are taken after having sex. Most   emergency contraceptives are available without a prescription. This method should not be used as your only form of birth control. °Barrier methods °Female condom ° °A female condom is a thin sheath that is worn over the penis during sex. Condoms keep sperm from going inside a woman's body. They can be used with a spermicide to increase their effectiveness. They should be disposed after a single use. °Female condom ° °A female condom is a soft, loose-fitting sheath that is put into the vagina before sex. The condom keeps sperm from going inside a woman's body. They should be disposed after a single use. °Diaphragm ° °A diaphragm is a soft, dome-shaped barrier. It is inserted into the vagina before sex, along with a spermicide. The diaphragm blocks sperm from entering the uterus, and the spermicide kills sperm. A diaphragm should be left in the vagina for 6-8 hours after sex and removed within 24 hours. °A diaphragm is prescribed and fitted by a health care provider. A diaphragm should be replaced every 1-2 years, after giving birth, after gaining more than 15 lb (6.8 kg), and after pelvic surgery. °Cervical cap ° °A cervical cap is a round, soft latex or plastic cup that fits over the cervix. It is  inserted into the vagina before sex, along with spermicide. It blocks sperm from entering the uterus. The cap should be left in place for 6-8 hours after sex and removed within 48 hours. A cervical cap must be prescribed and fitted by a health care provider. It should be replaced every 2 years. °Sponge ° °A sponge is a soft, circular piece of polyurethane foam with spermicide on it. The sponge helps block sperm from entering the uterus, and the spermicide kills sperm. To use it, you make it wet and then insert it into the vagina. It should be inserted before sex, left in for at least 6 hours after sex, and removed and thrown away within 30 hours. °Spermicides °Spermicides are chemicals that kill or block sperm from entering the cervix and uterus. They can come as a cream, jelly, suppository, foam, or tablet. A spermicide should be inserted into the vagina with an applicator at least 10-15 minutes before sex to allow time for it to work. The process must be repeated every time you have sex. Spermicides do not require a prescription. °Intrauterine contraception °Intrauterine device (IUD) °An IUD is a T-shaped device that is put in a woman's uterus. There are two types: °· Hormone IUD.This type contains progestin, a synthetic form of the hormone progesterone. This type can stay in place for 3-5 years. °· Copper IUD.This type is wrapped in copper wire. It can stay in place for 10 years. ° °Permanent methods of contraception °Female tubal ligation °In this method, a woman's fallopian tubes are sealed, tied, or blocked during surgery to prevent eggs from traveling to the uterus. °Hysteroscopic sterilization °In this method, a small, flexible insert is placed into each fallopian tube. The inserts cause scar tissue to form in the fallopian tubes and block them, so sperm cannot reach an egg. The procedure takes about 3 months to be effective. Another form of birth control must be used during those 3 months. °Female  sterilization °This is a procedure to tie off the tubes that carry sperm (vasectomy). After the procedure, the man can still ejaculate fluid (semen). °Natural planning methods °Natural family planning °In this method, a couple does not have sex on days when the woman could become pregnant. °Calendar method °This means keeping   track of the length of each menstrual cycle, identifying the days when pregnancy can happen, and not having sex on those days. °Ovulation method °In this method, a couple avoids sex during ovulation. °Symptothermal method °This method involves not having sex during ovulation. The woman typically checks for ovulation by watching changes in her temperature and in the consistency of cervical mucus. °Post-ovulation method °In this method, a couple waits to have sex until after ovulation. °Summary °· Contraception, also called birth control, means methods or devices that prevent pregnancy. °· Hormonal methods of contraception include implants, injections, pills, patches, vaginal rings, and emergency contraceptives. °· Barrier methods of contraception can include female condoms, female condoms, diaphragms, cervical caps, sponges, and spermicides. °· There are two types of IUDs (intrauterine devices). An IUD can be put in a woman's uterus to prevent pregnancy for 3-5 years. °· Permanent sterilization can be done through a procedure for males, females, or both. °· Natural family planning methods involve not having sex on days when the woman could become pregnant. °This information is not intended to replace advice given to you by your health care provider. Make sure you discuss any questions you have with your health care provider. °Document Released: 01/02/2005 Document Revised: 01/04/2017 Document Reviewed: 02/05/2016 °[employer] Interactive Patient Education © 2019 [employer] ° ° °Breastfeeding ° °Choosing to breastfeed is one of the best decisions you can make for yourself and your baby. A change in  hormones during pregnancy causes your breasts to make breast milk in your milk-producing glands. Hormones prevent breast milk from being released before your baby is born. They also prompt milk flow after birth. Once breastfeeding has begun, thoughts of your baby, as well as his or her sucking or crying, can stimulate the release of milk from your milk-producing glands. °Benefits of breastfeeding °Research shows that breastfeeding offers many health benefits for infants and mothers. It also offers a cost-free and convenient way to feed your baby. °For your baby °· Your first milk (colostrum) helps your baby's digestive system to function better. °· Special cells in your milk (antibodies) help your baby to fight off infections. °· Breastfed babies are less likely to develop asthma, allergies, obesity, or type 2 diabetes. They are also at lower risk for sudden infant death syndrome (SIDS). °· Nutrients in breast milk are better able to meet your baby’s needs compared to infant formula. °· Breast milk improves your baby's brain development. °For you °· Breastfeeding helps to create a very special bond between you and your baby. °· Breastfeeding is convenient. Breast milk costs nothing and is always available at the correct temperature. °· Breastfeeding helps to burn calories. It helps you to lose the weight that you gained during pregnancy. °· Breastfeeding makes your uterus return faster to its size before pregnancy. It also slows bleeding (lochia) after you give birth. °· Breastfeeding helps to lower your risk of developing type 2 diabetes, osteoporosis, rheumatoid arthritis, cardiovascular disease, and breast, ovarian, uterine, and endometrial cancer later in life. °Breastfeeding basics °Starting breastfeeding °· Find a comfortable place to sit or lie down, with your neck and back well-supported. °· Place a pillow or a rolled-up blanket under your baby to bring him or her to the level of your breast (if you are  seated). Nursing pillows are specially designed to help support your arms and your baby while you breastfeed. °· Make sure that your baby's tummy (abdomen) is facing your abdomen. °· Gently massage your breast. With your fingertips, massage from   the outer edges of your breast inward toward the nipple. This encourages milk flow. If your milk flows slowly, you may need to continue this action during the feeding. °· Support your breast with 4 fingers underneath and your thumb above your nipple (make the letter "C" with your hand). Make sure your fingers are well away from your nipple and your baby’s mouth. °· Stroke your baby's lips gently with your finger or nipple. °· When your baby's mouth is open wide enough, quickly bring your baby to your breast, placing your entire nipple and as much of the areola as possible into your baby's mouth. The areola is the colored area around your nipple. °? More areola should be visible above your baby's upper lip than below the lower lip. °? Your baby's lips should be opened and extended outward (flanged) to ensure an adequate, comfortable latch. °? Your baby's tongue should be between his or her lower gum and your breast. °· Make sure that your baby's mouth is correctly positioned around your nipple (latched). Your baby's lips should create a seal on your breast and be turned out (everted). °· It is common for your baby to suck about 2-3 minutes in order to start the flow of breast milk. °Latching °Teaching your baby how to latch onto your breast properly is very important. An improper latch can cause nipple pain, decreased milk supply, and poor weight gain in your baby. Also, if your baby is not latched onto your nipple properly, he or she may swallow some air during feeding. This can make your baby fussy. Burping your baby when you switch breasts during the feeding can help to get rid of the air. However, teaching your baby to latch on properly is still the best way to prevent  fussiness from swallowing air while breastfeeding. °Signs that your baby has successfully latched onto your nipple °· Silent tugging or silent sucking, without causing you pain. Infant's lips should be extended outward (flanged). °· Swallowing heard between every 3-4 sucks once your milk has started to flow (after your let-down milk reflex occurs). °· Muscle movement above and in front of his or her ears while sucking. °Signs that your baby has not successfully latched onto your nipple °· Sucking sounds or smacking sounds from your baby while breastfeeding. °· Nipple pain. °If you think your baby has not latched on correctly, slip your finger into the corner of your baby’s mouth to break the suction and place it between your baby's gums. Attempt to start breastfeeding again. °Signs of successful breastfeeding °Signs from your baby °· Your baby will gradually decrease the number of sucks or will completely stop sucking. °· Your baby will fall asleep. °· Your baby's body will relax. °· Your baby will retain a small amount of milk in his or her mouth. °· Your baby will let go of your breast by himself or herself. °Signs from you °· Breasts that have increased in firmness, weight, and size 1-3 hours after feeding. °· Breasts that are softer immediately after breastfeeding. °· Increased milk volume, as well as a change in milk consistency and color by the fifth day of breastfeeding. °· Nipples that are not sore, cracked, or bleeding. °Signs that your baby is getting enough milk °· Wetting at least 1-2 diapers during the first 24 hours after birth. °· Wetting at least 5-6 diapers every 24 hours for the first week after birth. The urine should be clear or pale yellow by the age of 5 days. °·   Wetting 6-8 diapers every 24 hours as your baby continues to grow and develop. °· At least 3 stools in a 24-hour period by the age of 5 days. The stool should be soft and yellow. °· At least 3 stools in a 24-hour period by the age of 7  days. The stool should be seedy and yellow. °· No loss of weight greater than 10% of birth weight during the first 3 days of life. °· Average weight gain of 4-7 oz (113-198 g) per week after the age of 4 days. °· Consistent daily weight gain by the age of 5 days, without weight loss after the age of 2 weeks. °After a feeding, your baby may spit up a small amount of milk. This is normal. °Breastfeeding frequency and duration °Frequent feeding will help you make more milk and can prevent sore nipples and extremely full breasts (breast engorgement). Breastfeed when you feel the need to reduce the fullness of your breasts or when your baby shows signs of hunger. This is called "breastfeeding on demand." Signs that your baby is hungry include: °· Increased alertness, activity, or restlessness. °· Movement of the head from side to side. °· Opening of the mouth when the corner of the mouth or cheek is stroked (rooting). °· Increased sucking sounds, smacking lips, cooing, sighing, or squeaking. °· Hand-to-mouth movements and sucking on fingers or hands. °· Fussing or crying. °Avoid introducing a pacifier to your baby in the first 4-6 weeks after your baby is born. After this time, you may choose to use a pacifier. Research has shown that pacifier use during the first year of a baby's life decreases the risk of sudden infant death syndrome (SIDS). °Allow your baby to feed on each breast as long as he or she wants. When your baby unlatches or falls asleep while feeding from the first breast, offer the second breast. Because newborns are often sleepy in the first few weeks of life, you may need to awaken your baby to get him or her to feed. °Breastfeeding times will vary from baby to baby. However, the following rules can serve as a guide to help you make sure that your baby is properly fed: °· Newborns (babies 4 weeks of age or younger) may breastfeed every 1-3 hours. °· Newborns should not go without breastfeeding for longer  than 3 hours during the day or 5 hours during the night. °· You should breastfeed your baby a minimum of 8 times in a 24-hour period. °Breast milk pumping ° °  ° °Pumping and storing breast milk allows you to make sure that your baby is exclusively fed your breast milk, even at times when you are unable to breastfeed. This is especially important if you go back to work while you are still breastfeeding, or if you are not able to be present during feedings. Your lactation consultant can help you find a method of pumping that works best for you and give you guidelines about how long it is safe to store breast milk. °Caring for your breasts while you breastfeed °Nipples can become dry, cracked, and sore while breastfeeding. The following recommendations can help keep your breasts moisturized and healthy: °· Avoid using soap on your nipples. °· Wear a supportive bra designed especially for nursing. Avoid wearing underwire-style bras or extremely tight bras (sports bras). °· Air-dry your nipples for 3-4 minutes after each feeding. °· Use only cotton bra pads to absorb leaked breast milk. Leaking of breast milk between feedings is   normal. °· Use lanolin on your nipples after breastfeeding. Lanolin helps to maintain your skin's normal moisture barrier. Pure lanolin is not harmful (not toxic) to your baby. You may also hand express a few drops of breast milk and gently massage that milk into your nipples and allow the milk to air-dry. °In the first few weeks after giving birth, some women experience breast engorgement. Engorgement can make your breasts feel heavy, warm, and tender to the touch. Engorgement peaks within 3-5 days after you give birth. The following recommendations can help to ease engorgement: °· Completely empty your breasts while breastfeeding or pumping. You may want to start by applying warm, moist heat (in the shower or with warm, water-soaked hand towels) just before feeding or pumping. This increases  circulation and helps the milk flow. If your baby does not completely empty your breasts while breastfeeding, pump any extra milk after he or she is finished. °· Apply ice packs to your breasts immediately after breastfeeding or pumping, unless this is too uncomfortable for you. To do this: °? Put ice in a plastic bag. °? Place a towel between your skin and the bag. °? Leave the ice on for 20 minutes, 2-3 times a day. °· Make sure that your baby is latched on and positioned properly while breastfeeding. °If engorgement persists after 48 hours of following these recommendations, contact your health care provider or a lactation consultant. °Overall health care recommendations while breastfeeding °· Eat 3 healthy meals and 3 snacks every day. Well-nourished mothers who are breastfeeding need an additional 450-500 calories a day. You can meet this requirement by increasing the amount of a balanced diet that you eat. °· Drink enough water to keep your urine pale yellow or clear. °· Rest often, relax, and continue to take your prenatal vitamins to prevent fatigue, stress, and low vitamin and mineral levels in your body (nutrient deficiencies). °· Do not use any products that contain nicotine or tobacco, such as cigarettes and e-cigarettes. Your baby may be harmed by chemicals from cigarettes that pass into breast milk and exposure to secondhand smoke. If you need help quitting, ask your health care provider. °· Avoid alcohol. °· Do not use illegal drugs or marijuana. °· Talk with your health care provider before taking any medicines. These include over-the-counter and prescription medicines as well as vitamins and herbal supplements. Some medicines that may be harmful to your baby can pass through breast milk. °· It is possible to become pregnant while breastfeeding. If birth control is desired, ask your health care provider about options that will be safe while breastfeeding your baby. °Where to find more  information: °[employer]: www.llli.org °Contact a health care provider if: °· You feel like you want to stop breastfeeding or have become frustrated with breastfeeding. °· Your nipples are cracked or bleeding. °· Your breasts are red, tender, or warm. °· You have: °? Painful breasts or nipples. °? A swollen area on either breast. °? A fever or chills. °? Nausea or vomiting. °? Drainage other than breast milk from your nipples. °· Your breasts do not become full before feedings by the fifth day after you give birth. °· You feel sad and depressed. °· Your baby is: °? Too sleepy to eat well. °? Having trouble sleeping. °? More than 1 week old and wetting fewer than 6 diapers in a 24-hour period. °? Not gaining weight by 5 days of age. °· Your baby has fewer than 3 stools in a   24-hour period. °· Your baby's skin or the white parts of his or her eyes become yellow. °Get help right away if: °· Your baby is overly tired (lethargic) and does not want to wake up and feed. °· Your baby develops an unexplained fever. °Summary °· Breastfeeding offers many health benefits for infant and mothers. °· Try to breastfeed your infant when he or she shows early signs of hunger. °· Gently tickle or stroke your baby's lips with your finger or nipple to allow the baby to open his or her mouth. Bring the baby to your breast. Make sure that much of the areola is in your baby's mouth. Offer one side and burp the baby before you offer the other side. °· Talk with your health care provider or lactation consultant if you have questions or you face problems as you breastfeed. °This information is not intended to replace advice given to you by your health care provider. Make sure you discuss any questions you have with your health care provider. °Document Released: 01/02/2005 Document Revised: 02/04/2016 Document Reviewed: 02/04/2016 °Elsevier Interactive Patient Education © 2019 Elsevier Inc. ° °

## 2018-04-17 NOTE — Progress Notes (Signed)
  Subjective:    Jasmine Friedman is a G2P0010 [redacted]w[redacted]d being seen today for her first obstetrical visit.  Her obstetrical history is significant for first pregnancy. Patient does intend to breast feed. Pregnancy history fully reviewed.  Patient reports no complaints.  Vitals:   04/17/18 0826  BP: 117/64  Pulse: 68  Weight: 186 lb (84.4 kg)    HISTORY: OB History  Gravida Para Term Preterm AB Living  2       1 0  SAB TAB Ectopic Multiple Live Births          0    # Outcome Date GA Lbr Len/2nd Weight Sex Delivery Anes PTL Lv  2 Current           1 AB 05/16/16           Past Medical History:  Diagnosis Date  . Bipolar 2 disorder (HCC)    missed diagnosis pt states should be PTSD  . Depression    no meds, doing good  . GERD (gastroesophageal reflux disease)   . HSV infection   . Infection    UTI   Past Surgical History:  Procedure Laterality Date  . BUNIONECTOMY Right 10/13   Family History  Problem Relation Age of Onset  . Healthy Mother   . Cancer Father   . Kidney disease Father   . Hypertension Father   . Heart disease Paternal Grandfather      Exam    Uterus:   16-weeks      Assessment:    Pregnancy: G2P0010 Patient Active Problem List   Diagnosis Date Noted  . Maternal obesity affecting pregnancy, antepartum 04/17/2018  . History of herpes genitalis 04/17/2018  . Supervision of other normal pregnancy, antepartum 04/16/2018        Plan:     Initial labs reviewed. Prenatal vitamins. Problem list reviewed and updated. Genetic Screening discussed : panorama and AFP declined.  Ultrasound discussed; fetal survey: ordered.  Follow up in 4 weeks. Patient understands that her next visit will be via Telehealth. She has access to a BP cuff at home 50% of 30 min visit spent on counseling and coordination of care.     Jasmine Friedman 04/17/2018

## 2018-04-22 ENCOUNTER — Telehealth: Payer: Self-pay | Admitting: Obstetrics and Gynecology

## 2018-04-22 NOTE — Telephone Encounter (Signed)
I'm calling to inform you that during your office visit, you may have been exposed to a heath care worker who had an unknown respiratory illness. The employee had no fever but out of caution, we are notifying you so that you can be on alert for any of the following symptoms: fever, cough, sore throat, shortness of breath, or body aches. If any of these symptoms occur 04/16/18-04/30/18 please contact us at [phone] and notify your primary care physician. We will get back to you during normal business hours. Considering the current respiratory illness season, please continue to practice social distancing, stay home if possible, and quarantine yourself until April 14  Patient denies any symptoms at this time.

## 2018-04-30 ENCOUNTER — Telehealth: Payer: Self-pay

## 2018-04-30 NOTE — Telephone Encounter (Signed)
Returned call to patient, she wanted a scale and to know how much coffee she can drink.    Patient was advised that we do not have any scales and she can drink 1-2 cups/day per Dr. Clearance Coots.

## 2018-05-09 ENCOUNTER — Other Ambulatory Visit: Payer: Self-pay

## 2018-05-09 ENCOUNTER — Ambulatory Visit (HOSPITAL_COMMUNITY)
Admission: RE | Admit: 2018-05-09 | Discharge: 2018-05-09 | Disposition: A | Discharge status: Home / Self Care | Payer: No Typology Code available for payment source | Source: Ambulatory Visit | Attending: Obstetrics and Gynecology | Admitting: Obstetrics and Gynecology

## 2018-05-09 DIAGNOSIS — Z363 Encounter for antenatal screening for malformations: Secondary | ICD-10-CM | POA: Diagnosis not present

## 2018-05-09 DIAGNOSIS — Z3A19 19 weeks gestation of pregnancy: Secondary | ICD-10-CM | POA: Diagnosis not present

## 2018-05-09 DIAGNOSIS — Z3480 Encounter for supervision of other normal pregnancy, unspecified trimester: Secondary | ICD-10-CM | POA: Diagnosis present

## 2018-05-15 ENCOUNTER — Other Ambulatory Visit: Payer: Self-pay

## 2018-05-15 ENCOUNTER — Ambulatory Visit (INDEPENDENT_AMBULATORY_CARE_PROVIDER_SITE_OTHER): Payer: No Typology Code available for payment source | Admitting: Obstetrics & Gynecology

## 2018-05-15 VITALS — BP 125/65 | HR 70

## 2018-05-15 DIAGNOSIS — Z3A19 19 weeks gestation of pregnancy: Secondary | ICD-10-CM

## 2018-05-15 DIAGNOSIS — Z3482 Encounter for supervision of other normal pregnancy, second trimester: Secondary | ICD-10-CM

## 2018-05-15 DIAGNOSIS — Z3480 Encounter for supervision of other normal pregnancy, unspecified trimester: Secondary | ICD-10-CM

## 2018-05-15 NOTE — Progress Notes (Signed)
   TELEHEALTH VIRTUAL OBSTETRICS VISIT ENCOUNTER NOTE  I connected with Jasmine Friedman on 05/14/25 at  2:00 PM EDT by telephone at home and verified that I am speaking with the correct person using two identifiers.   I discussed the limitations, risks, security and privacy concerns of performing an evaluation and management service by telephone and the availability of in person appointments. I also discussed with the patient that there may be a patient responsible charge related to this service. The patient expressed understanding and agreed to proceed.  Subjective:  Jasmine Friedman is a 28 y.o. G2P0010 at [redacted]w[redacted]d being followed for ongoing prenatal care.  She is currently monitored for the following issues for this low-risk pregnancy and has Supervision of other normal pregnancy, antepartum; Maternal obesity affecting pregnancy, antepartum; and History of herpes genitalis on their problem list.  Patient reports no complaints. Reports fetal movement. Denies any contractions, bleeding or leaking of fluid.   The following portions of the patient's history were reviewed and updated as appropriate: allergies, current medications, past family history, past medical history, past social history, past surgical history and problem list.   Objective:   General:  Alert, oriented and cooperative.   Mental Status: Normal mood and affect perceived. Normal judgment and thought content.  Rest of physical exam deferred due to type of encounter  Assessment and Plan:  Pregnancy: G2P0010 at [redacted]w[redacted]d 1. Supervision of other normal pregnancy, antepartum [place] result reviewed and HSV precautions  Preterm labor symptoms and general obstetric precautions including but not limited to vaginal bleeding, contractions, leaking of fluid and fetal movement were reviewed in detail with the patient.  I discussed the assessment and treatment plan with the patient. The patient was provided an opportunity to ask questions and all  were answered. The patient agreed with the plan and demonstrated an understanding of the instructions. The patient was advised to call back or seek an in-person office evaluation/go to [facility]U at [facility] for any urgent or concerning symptoms. Please refer to After Visit Summary for other counseling recommendations.   I provided 10 minutes of non-face-to-face time during this encounter.  No follow-ups on file.  No future appointments.  Scheryl Darter, MD Center for [facility], [facility]

## 2018-05-15 NOTE — Patient Instructions (Signed)

## 2018-05-15 NOTE — Progress Notes (Signed)
Pt is on the phone preparing for Webex visit with provider. [redacted]w[redacted]d. Pt has Babys[employer]s BP cuff at home and is able to check BP while on the phone.

## 2018-07-05 ENCOUNTER — Other Ambulatory Visit: Payer: Self-pay

## 2018-07-05 ENCOUNTER — Ambulatory Visit (INDEPENDENT_AMBULATORY_CARE_PROVIDER_SITE_OTHER): Payer: No Typology Code available for payment source

## 2018-07-05 DIAGNOSIS — N898 Other specified noninflammatory disorders of vagina: Secondary | ICD-10-CM

## 2018-07-05 DIAGNOSIS — Z113 Encounter for screening for infections with a predominantly sexual mode of transmission: Secondary | ICD-10-CM

## 2018-07-05 NOTE — Progress Notes (Signed)
SUBJECTIVE:  28 y.o. female complains of white vaginal discharge. Denies abnormal vaginal bleeding or significant pelvic pain or fever. No UTI symptoms. Denies history of known exposure to STD.  Patient's last menstrual period was 12/27/2017.  OBJECTIVE:   She appears well, afebrile. Urine dipstick: not done.  ASSESSMENT:  Vaginal Discharge  Vaginal Odor   PLAN:  GC, chlamydia, trichomonas, BVAG, CVAG probe sent to lab. Treatment: To be determined once lab results are received ROV prn if symptoms persist or worsen.

## 2018-07-08 LAB — CERVICOVAGINAL ANCILLARY ONLY
Bacterial vaginitis: NEGATIVE
Candida vaginitis: NEGATIVE
Chlamydia: NEGATIVE
Neisseria Gonorrhea: POSITIVE — AB
Trichomonas: NEGATIVE

## 2018-07-08 NOTE — Progress Notes (Signed)
I have reviewed this chart and agree with the RN/CMA assessment and management.    Theone Bowell C Jazen Spraggins, MD, FACOG Attending Physician, [facility]  

## 2018-07-09 ENCOUNTER — Ambulatory Visit (INDEPENDENT_AMBULATORY_CARE_PROVIDER_SITE_OTHER): Payer: No Typology Code available for payment source

## 2018-07-09 ENCOUNTER — Telehealth: Payer: Self-pay

## 2018-07-09 ENCOUNTER — Other Ambulatory Visit: Payer: Self-pay

## 2018-07-09 DIAGNOSIS — O98219 Gonorrhea complicating pregnancy, unspecified trimester: Secondary | ICD-10-CM | POA: Insufficient documentation

## 2018-07-09 DIAGNOSIS — O98213 Gonorrhea complicating pregnancy, third trimester: Secondary | ICD-10-CM | POA: Diagnosis not present

## 2018-07-09 MED ORDER — AZITHROMYCIN 500 MG PO TABS
1000.0000 mg | ORAL_TABLET | Freq: Once | ORAL | Status: AC
  Administered 2018-07-09: 1000 mg via ORAL

## 2018-07-09 MED ORDER — CEFTRIAXONE SODIUM 250 MG IJ SOLR
250.0000 mg | Freq: Once | INTRAMUSCULAR | Status: AC
  Administered 2018-07-09: 250 mg via INTRAMUSCULAR

## 2018-07-09 NOTE — Progress Notes (Addendum)
Nurse visit for [facility] treatment.  Rocephin 250 mg with 0.9 Lidocaine 1% given RUOQ without difficulty.  Pt ate 2 hrs ago, administered Azithromycin 500 mg x 2 po STAT

## 2018-07-09 NOTE — Telephone Encounter (Signed)
Pt aware of positive Gonorrhea.  She is available today for an appt to receive txt. Partner txt recommended. No IC until both TOC's are negative.  [facility] notified.

## 2018-07-09 NOTE — Telephone Encounter (Signed)
-----   Message from Emily Filbert, MD sent at 07/09/2018  8:44 AM EDT ----- She needs to come in for treatment for GC. I don't know what email to send that message to, so I'm hoping you can call the patient and let her know to come in for treatment. Thanks

## 2018-07-09 NOTE — Telephone Encounter (Signed)
LVM for pt to cb.

## 2018-07-10 NOTE — Progress Notes (Signed)
I have reviewed the chart and agree with nursing staff's documentation of this patient's encounter.  Mora Bellman, MD 07/10/2018 2:23 AM

## 2018-07-11 ENCOUNTER — Other Ambulatory Visit: Payer: No Typology Code available for payment source

## 2018-07-11 ENCOUNTER — Encounter: Payer: Self-pay | Admitting: Obstetrics and Gynecology

## 2018-07-11 ENCOUNTER — Other Ambulatory Visit: Payer: Self-pay

## 2018-07-11 ENCOUNTER — Ambulatory Visit (INDEPENDENT_AMBULATORY_CARE_PROVIDER_SITE_OTHER): Payer: No Typology Code available for payment source | Admitting: Obstetrics and Gynecology

## 2018-07-11 VITALS — BP 114/62 | HR 64 | Wt 200.0 lb

## 2018-07-11 DIAGNOSIS — O99210 Obesity complicating pregnancy, unspecified trimester: Secondary | ICD-10-CM

## 2018-07-11 DIAGNOSIS — Z23 Encounter for immunization: Secondary | ICD-10-CM | POA: Diagnosis not present

## 2018-07-11 DIAGNOSIS — O99213 Obesity complicating pregnancy, third trimester: Secondary | ICD-10-CM

## 2018-07-11 DIAGNOSIS — O26843 Uterine size-date discrepancy, third trimester: Secondary | ICD-10-CM

## 2018-07-11 DIAGNOSIS — O26849 Uterine size-date discrepancy, unspecified trimester: Secondary | ICD-10-CM

## 2018-07-11 DIAGNOSIS — Z3A28 28 weeks gestation of pregnancy: Secondary | ICD-10-CM

## 2018-07-11 DIAGNOSIS — Z8619 Personal history of other infectious and parasitic diseases: Secondary | ICD-10-CM

## 2018-07-11 DIAGNOSIS — Z3480 Encounter for supervision of other normal pregnancy, unspecified trimester: Secondary | ICD-10-CM

## 2018-07-11 DIAGNOSIS — O98213 Gonorrhea complicating pregnancy, third trimester: Secondary | ICD-10-CM

## 2018-07-11 NOTE — Progress Notes (Signed)
   PRENATAL VISIT NOTE  Subjective:  Jasmine Friedman is a 28 y.o. G2P0010 at [redacted]w[redacted]d being seen today for ongoing prenatal care.  She is currently monitored for the following issues for this high-risk pregnancy and has Supervision of other normal pregnancy, antepartum; Maternal obesity affecting pregnancy, antepartum; History of herpes genitalis; and Gonorrhea in pregnancy on their problem list.  Patient reports no complaints.  Contractions: Not present. Vag. Bleeding: None.  Movement: Present. Denies leaking of fluid.   The following portions of the patient's history were reviewed and updated as appropriate: allergies, current medications, past family history, past medical history, past social history, past surgical history and problem list.   Objective:   Vitals:   07/11/18 0848  BP: 114/62  Pulse: 64  Weight: 200 lb (90.7 kg)   Fetal Status: Fetal Heart Rate (bpm): 140 Fundal Height: 30 cm Movement: Present     General:  Alert, oriented and cooperative. Patient is in no acute distress.  Skin: Skin is warm and dry. No rash noted.   Cardiovascular: Normal heart rate noted  Respiratory: Normal respiratory effort, no problems with respiration noted  Abdomen: Soft, gravid, appropriate for gestational age.  Pain/Pressure: Absent     Pelvic: Cervical exam deferred        Extremities: Normal range of motion.     Mental Status: Normal mood and affect. Normal behavior. Normal judgment and thought content.   Assessment and Plan:  Pregnancy: G2P0010 at [redacted]w[redacted]d  1. Supervision of other normal pregnancy, antepartum - CBC - Glucose Tolerance, 2 Hours w/1 Hour - HIV Antibody (routine testing w rflx) - RPR - Briefly reviewed [employer], she is possibly interested  2. Maternal gonorrhea in third trimester TOC next visit  3. History of herpes genitalis ppx 36 weeks  4. Maternal obesity affecting pregnancy, antepartum  5. Fetal size > dates - MFM f/u ultrasound ordered  Preterm labor  symptoms and general obstetric precautions including but not limited to vaginal bleeding, contractions, leaking of fluid and fetal movement were reviewed in detail with the patient. Please refer to After Visit Summary for other counseling recommendations.   Return in about 2 weeks (around 07/25/2018) for in person for swabs.  Future Appointments  Date Time Provider [place]  07/25/2018 10:15 AM Lavonia Drafts, MD [place] None    Sloan Leiter, MD

## 2018-07-11 NOTE — Patient Instructions (Signed)
Use the following websites (and others) to help learn more about your contraception options and find the method that is right for you!  - The Centers for Disease Control (CDC) website: https://www.cdc.gov/reproductivehealth/contraception/index.htm  - [facility] website: https://www.plannedparenthood.org/learn/birth-control  - Bedsider.org: https://www.bedsider.org/methods  

## 2018-07-12 ENCOUNTER — Other Ambulatory Visit: Payer: No Typology Code available for payment source

## 2018-07-13 LAB — CBC
Hematocrit: 34.3 % (ref 34.0–46.6)
Hemoglobin: 11.5 g/dL (ref 11.1–15.9)
MCH: 30.6 pg (ref 26.6–33.0)
MCHC: 33.5 g/dL (ref 31.5–35.7)
MCV: 91 fL (ref 79–97)
Platelets: 250 10*3/uL (ref 150–450)
RBC: 3.76 x10E6/uL — ABNORMAL LOW (ref 3.77–5.28)
RDW: 12.2 % (ref 11.7–15.4)
WBC: 8.5 10*3/uL (ref 3.4–10.8)

## 2018-07-13 LAB — GLUCOSE TOLERANCE, 2 HOURS W/ 1HR
Glucose, 1 hour: 139 mg/dL (ref 65–179)
Glucose, 2 hour: 108 mg/dL (ref 65–152)
Glucose, Fasting: 69 mg/dL (ref 65–91)

## 2018-07-13 LAB — RPR: RPR Ser Ql: NONREACTIVE

## 2018-07-13 LAB — HIV ANTIBODY (ROUTINE TESTING W REFLEX): HIV Screen 4th Generation wRfx: NONREACTIVE

## 2018-07-22 ENCOUNTER — Ambulatory Visit (HOSPITAL_COMMUNITY): Payer: No Typology Code available for payment source

## 2018-07-22 ENCOUNTER — Other Ambulatory Visit: Payer: Self-pay

## 2018-07-22 ENCOUNTER — Other Ambulatory Visit (HOSPITAL_COMMUNITY)
Admission: RE | Admit: 2018-07-22 | Discharge: 2018-07-22 | Disposition: A | Discharge status: Home / Self Care | Payer: No Typology Code available for payment source | Source: Ambulatory Visit | Attending: Obstetrics & Gynecology | Admitting: Obstetrics & Gynecology

## 2018-07-22 ENCOUNTER — Ambulatory Visit (INDEPENDENT_AMBULATORY_CARE_PROVIDER_SITE_OTHER): Payer: No Typology Code available for payment source

## 2018-07-22 VITALS — BP 131/76 | HR 98 | Wt 204.5 lb

## 2018-07-22 DIAGNOSIS — Z3480 Encounter for supervision of other normal pregnancy, unspecified trimester: Secondary | ICD-10-CM | POA: Diagnosis present

## 2018-07-22 DIAGNOSIS — Z3A29 29 weeks gestation of pregnancy: Secondary | ICD-10-CM

## 2018-07-22 DIAGNOSIS — Z8619 Personal history of other infectious and parasitic diseases: Secondary | ICD-10-CM | POA: Diagnosis not present

## 2018-07-22 DIAGNOSIS — O98213 Gonorrhea complicating pregnancy, third trimester: Secondary | ICD-10-CM | POA: Diagnosis not present

## 2018-07-22 DIAGNOSIS — O9813 Syphilis complicating the puerperium: Secondary | ICD-10-CM | POA: Diagnosis present

## 2018-07-22 NOTE — Progress Notes (Signed)
Patient reports fetal movement, denies pain. 

## 2018-07-22 NOTE — Patient Instructions (Signed)

## 2018-07-22 NOTE — Progress Notes (Signed)
   PRENATAL VISIT NOTE  Subjective:  Jasmine Friedman is a 28 y.o. G2P0010 at [redacted]w[redacted]d who presents today for routine prenatal care.  She is currently being monitored for supervision of a low-risk pregnancy with problems as listed below.  Patient has no pregnancy related concerns and endorses fetal movement.  She denies vaginal concerns including discharge, bleeding, leaking, itching, and burning.   Patient Active Problem List   Diagnosis Date Noted  . Gonorrhea in pregnancy 07/09/2018  . Maternal obesity affecting pregnancy, antepartum 04/17/2018  . History of herpes genitalis 04/17/2018  . Supervision of other normal pregnancy, antepartum 04/16/2018    The following portions of the patient's history were reviewed and updated as appropriate: allergies, current medications, past family history, past medical history, past social history, past surgical history and problem list. Problem list updated.  Objective:   Vitals:   07/22/18 0851  BP: 131/76  Pulse: 98  Weight: 204 lb 8 oz (92.8 kg)    Fetal Status: Fetal Heart Rate (bpm): 130 Fundal Height: 32 cm Movement: Present     General:  Alert, oriented and cooperative. Patient is in no acute distress.  Skin: Skin is warm and dry.   Cardiovascular: Regular rate and rhythm.  Respiratory: Normal respiratory effort. CTA-Bilaterally  Abdomen: Soft, gravid, appropriate for gestational age.  Pelvic: Cervical exam deferred        Extremities: Normal range of motion.  Edema: None  Mental Status: Normal mood and affect. Normal behavior. Normal judgment and thought content.   Assessment and Plan:  Pregnancy: G2P0010 at [redacted]w[redacted]d  1. Supervision of other normal pregnancy, antepartum -Anticipatory guidance for upcoming appointments including next visit via Webex and Group B at 36 weeks. -Discussed fundal height and suspected LGA infant.  Informed that close monitoring would occur, but further intervention (early induction) is not always necessary.  -Encouraged to prepare for baby including selection of pediatrician. -Desires infant circumcision -Desires oral contraception in postpartum period.  Discussed risks and benefits of progestin only pills.   2. Maternal gonorrhea in third trimester -TOC collected today albeit early as treatment occurred 6/23.  3. History of herpes genitalis -Patient reports herpes in rectum and questions necessity of C/S if having active outbreak. -Informed that due to proximity of anus to vagina informed of this providers recommendation for C/S. -Discussed initiation of valtrex prophylaxis at 36 weeks to avoid outbreak prior to delivery. -Informed that medication will be sent in at 36 week visit.    Preterm labor symptoms and general obstetric precautions including but not limited to vaginal bleeding, contractions, leaking of fluid and fetal movement were reviewed with the patient.  Please refer to After Visit Summary for other counseling recommendations.  Return in about 2 weeks (around 08/05/2018) for LR-ROB via Webex.  Future Appointments  Date Time Provider [place]  07/22/2018  2:15 PM [place], CNM 07/22/2018, 9:18 AM

## 2018-07-24 ENCOUNTER — Encounter: Payer: No Typology Code available for payment source | Admitting: Obstetrics & Gynecology

## 2018-07-24 ENCOUNTER — Other Ambulatory Visit: Payer: No Typology Code available for payment source

## 2018-07-24 LAB — CERVICOVAGINAL ANCILLARY ONLY
Chlamydia: NEGATIVE
Neisseria Gonorrhea: NEGATIVE

## 2018-07-25 ENCOUNTER — Encounter: Payer: No Typology Code available for payment source | Admitting: Obstetrics & Gynecology

## 2018-08-02 ENCOUNTER — Other Ambulatory Visit: Payer: Self-pay

## 2018-08-02 ENCOUNTER — Ambulatory Visit (HOSPITAL_COMMUNITY)
Admission: RE | Admit: 2018-08-02 | Discharge: 2018-08-02 | Disposition: A | Discharge status: Home / Self Care | Payer: No Typology Code available for payment source | Source: Ambulatory Visit | Attending: Obstetrics and Gynecology | Admitting: Obstetrics and Gynecology

## 2018-08-02 DIAGNOSIS — Z362 Encounter for other antenatal screening follow-up: Secondary | ICD-10-CM

## 2018-08-02 DIAGNOSIS — Z3A31 31 weeks gestation of pregnancy: Secondary | ICD-10-CM | POA: Diagnosis not present

## 2018-08-02 DIAGNOSIS — O26849 Uterine size-date discrepancy, unspecified trimester: Secondary | ICD-10-CM | POA: Insufficient documentation

## 2018-08-02 DIAGNOSIS — O26843 Uterine size-date discrepancy, third trimester: Secondary | ICD-10-CM | POA: Diagnosis not present

## 2018-08-05 ENCOUNTER — Telehealth (INDEPENDENT_AMBULATORY_CARE_PROVIDER_SITE_OTHER): Payer: No Typology Code available for payment source | Admitting: Advanced Practice Midwife

## 2018-08-05 ENCOUNTER — Encounter: Payer: Self-pay | Admitting: Advanced Practice Midwife

## 2018-08-05 ENCOUNTER — Other Ambulatory Visit: Payer: Self-pay

## 2018-08-05 DIAGNOSIS — Z3480 Encounter for supervision of other normal pregnancy, unspecified trimester: Secondary | ICD-10-CM

## 2018-08-05 DIAGNOSIS — O99210 Obesity complicating pregnancy, unspecified trimester: Secondary | ICD-10-CM

## 2018-08-05 DIAGNOSIS — O98213 Gonorrhea complicating pregnancy, third trimester: Secondary | ICD-10-CM

## 2018-08-05 DIAGNOSIS — Z8619 Personal history of other infectious and parasitic diseases: Secondary | ICD-10-CM

## 2018-08-05 DIAGNOSIS — Z3A31 31 weeks gestation of pregnancy: Secondary | ICD-10-CM

## 2018-08-05 DIAGNOSIS — O99213 Obesity complicating pregnancy, third trimester: Secondary | ICD-10-CM

## 2018-08-05 NOTE — Progress Notes (Signed)
B/P: 116/73   P: 92  Pt checked B/P while on the phone.

## 2018-08-05 NOTE — Progress Notes (Signed)
   TELEHEALTH VIRTUAL OBSTETRICS VISIT ENCOUNTER NOTE  I connected with [place] on 08/05/18 at 10:45 AM EDT by telephone at home and verified that I am speaking with the correct person using two identifiers.   I discussed the limitations, risks, security and privacy concerns of performing an evaluation and management service by telephone and the availability of in person appointments. I also discussed with the patient that there may be a patient responsible charge related to this service. The patient expressed understanding and agreed to proceed.  Subjective:  Jasmine Friedman is a 28 y.o. G2P0010 at [redacted]w[redacted]d being followed for ongoing prenatal care.  She is currently monitored for the following issues for this low-risk pregnancy and has Supervision of other normal pregnancy, antepartum; Maternal obesity affecting pregnancy, antepartum; History of herpes genitalis; and Gonorrhea in pregnancy on their problem list.  Patient reports no complaints. Reports fetal movement. Denies any contractions, bleeding or leaking of fluid.   The following portions of the patient's history were reviewed and updated as appropriate: allergies, current medications, past family history, past medical history, past social history, past surgical history and problem list.   Objective:   General:  Alert, oriented and cooperative.   Mental Status: Normal mood and affect perceived. Normal judgment and thought content.  Rest of physical exam deferred due to type of encounter  Assessment and Plan:  Pregnancy: G2P0010 at [redacted]w[redacted]d 1. Maternal obesity affecting pregnancy, antepartum   2. History of herpes genitalis --Pt to start Valtrex at 34-36 weeks  3. Supervision of other normal pregnancy, antepartum --Anticipatory guidance about next visits/weeks of pregnancy given. --Pt has moved and is transferring care to new OB office, records have been sent. --Questions answered about safe exercise in pregnancy --Pt to  contact office if she needs f/u before getting established with new provider.  4. Maternal gonorrhea in third trimester --TOC negative  Preterm labor symptoms and general obstetric precautions including but not limited to vaginal bleeding, contractions, leaking of fluid and fetal movement were reviewed in detail with the patient.  I discussed the assessment and treatment plan with the patient. The patient was provided an opportunity to ask questions and all were answered. The patient agreed with the plan and demonstrated an understanding of the instructions. The patient was advised to call back or seek an in-person office evaluation/go to MAU at [facility] for any urgent or concerning symptoms. Please refer to After Visit Summary for other counseling recommendations.   I provided 10 minutes of non-face-to-face time during this encounter.  No follow-ups on file.  No future appointments.  Fatima Blank, [place] for [employer], [place]

## 2018-08-08 ENCOUNTER — Telehealth: Payer: Self-pay

## 2018-08-08 NOTE — Telephone Encounter (Signed)
Returned call, pt stated that she had a tiny bit of spotting previously when wiping, but none now. Advised of abnormal symptoms to watch for, pt agreed.

## 2018-12-26 ENCOUNTER — Encounter (HOSPITAL_COMMUNITY): Payer: Self-pay

## 2020-09-09 IMAGING — US US MFM OB COMP + 14 WK
1 series · 14 of 28 positions shown · non-contrast
Comparison: none

[Series 1: us mfm ob comp + 14 wk · 120 acquisitions, 14 frames shown]
[im 5/120]
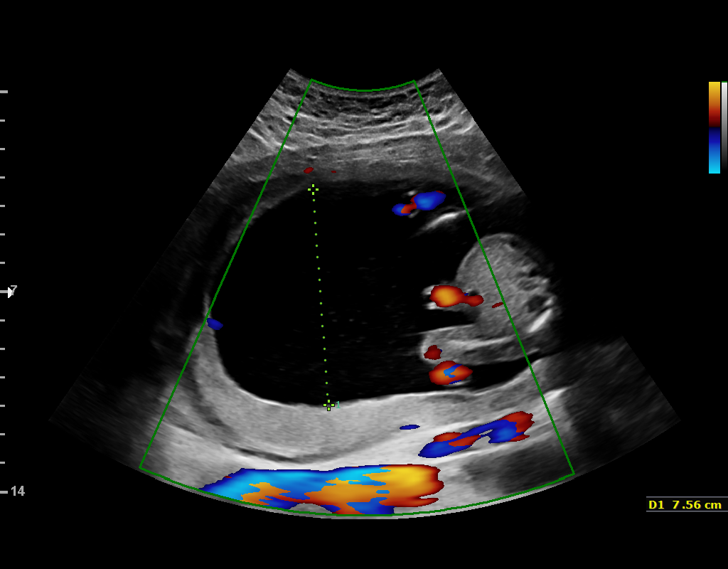
[im 14/120]
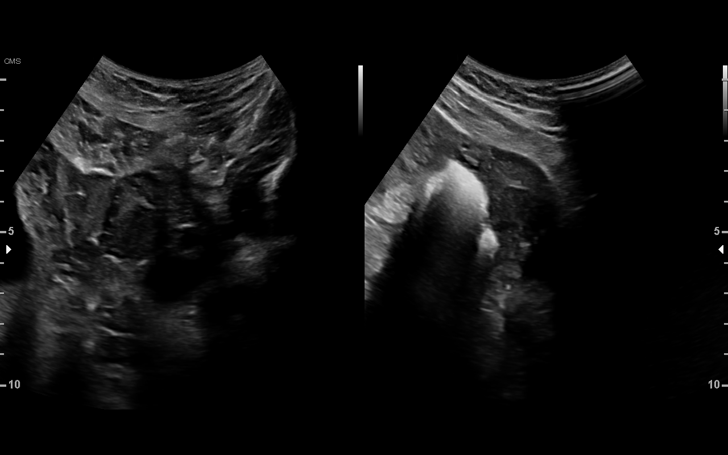
[im 23/120]
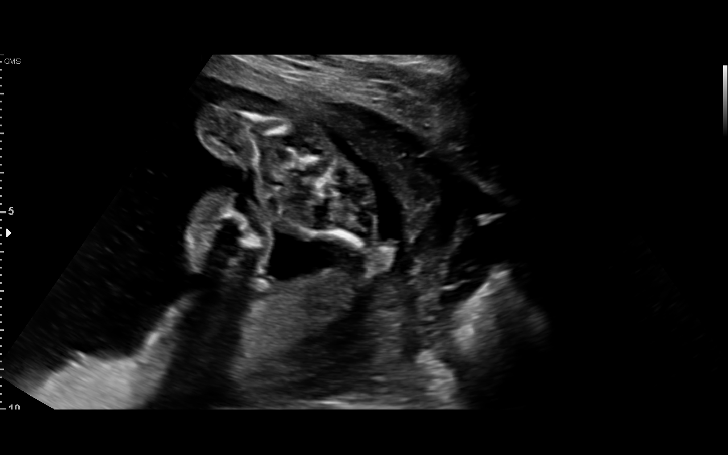
[im 31/120]
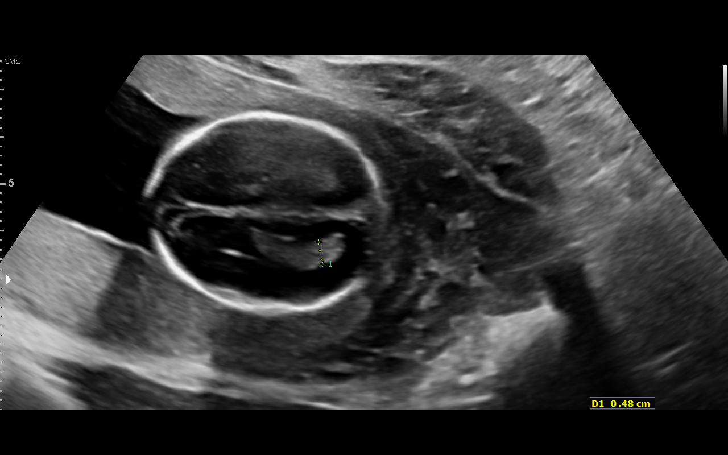
[im 40/120]
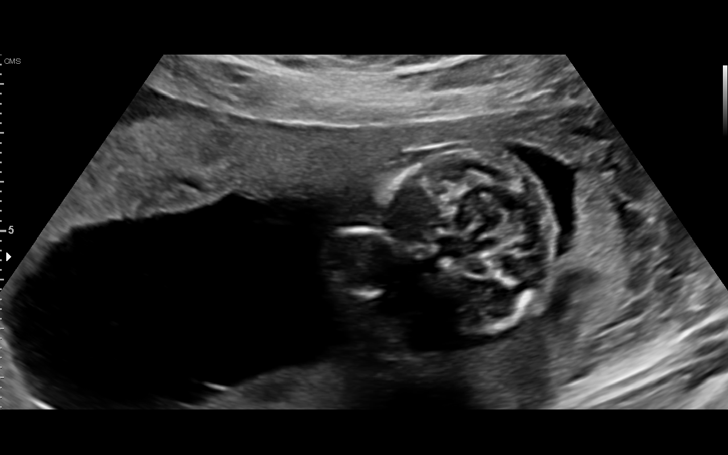
[im 49/120]
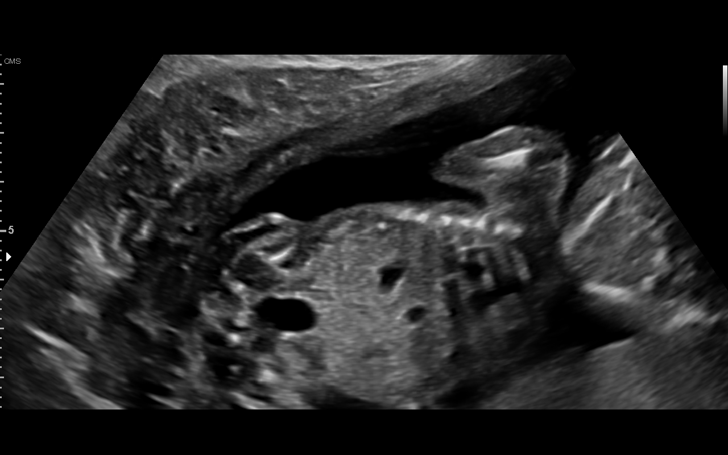
[im 58/120]
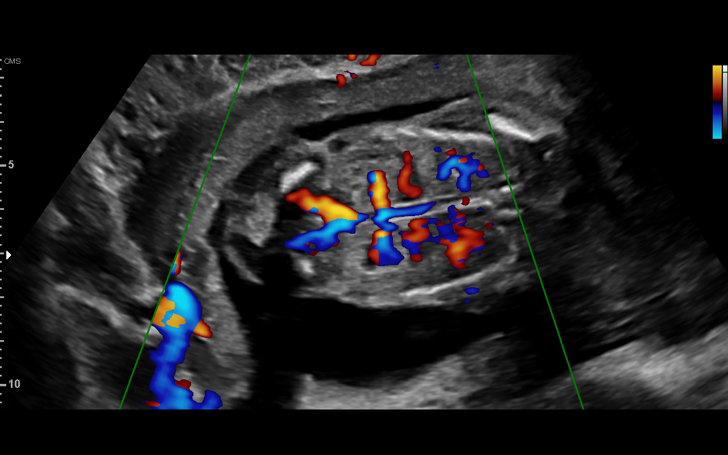
[im 67/120]
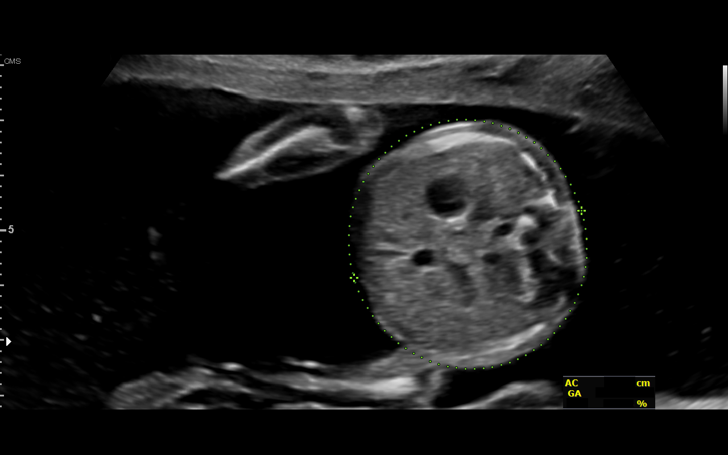
[im 75/120]
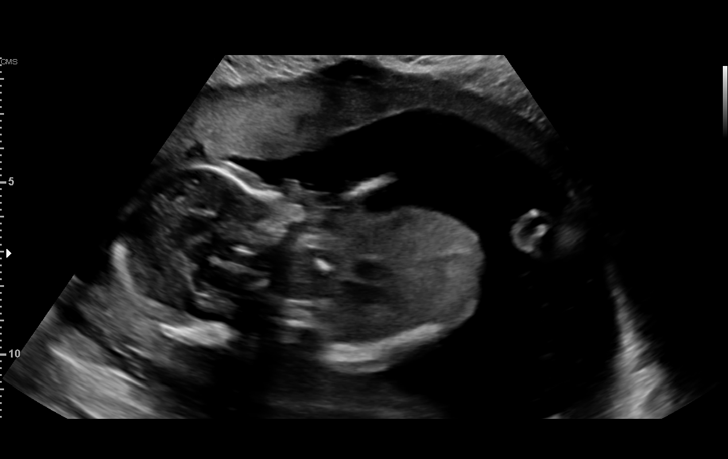
[im 84/120]
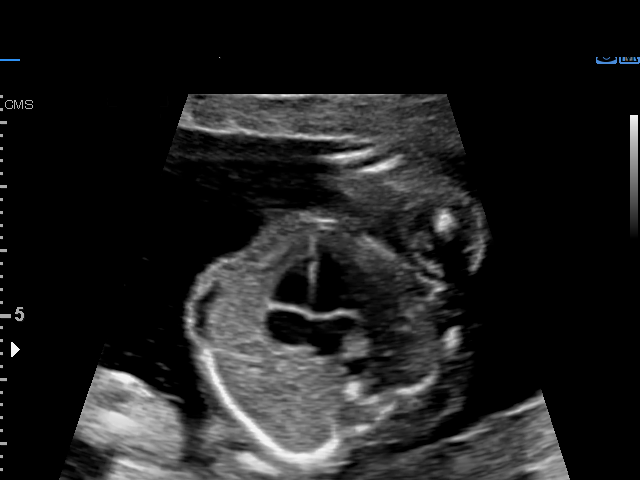
[im 93/120]
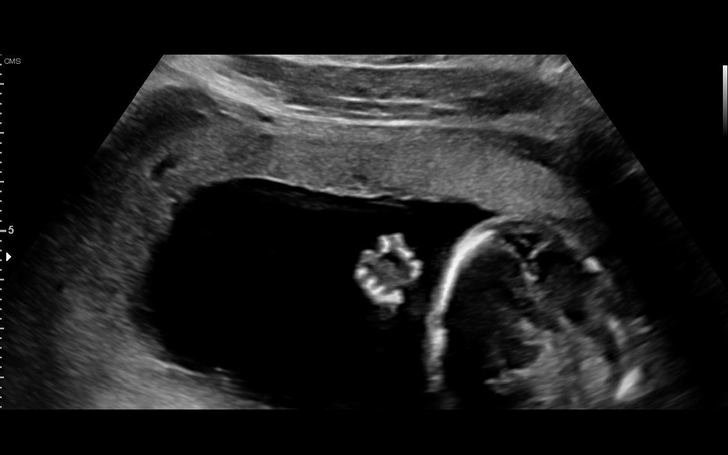
[im 102/120]
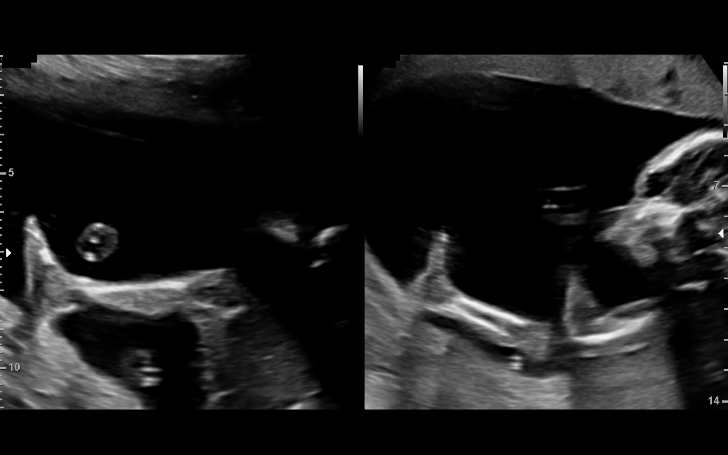
[im 111/120]
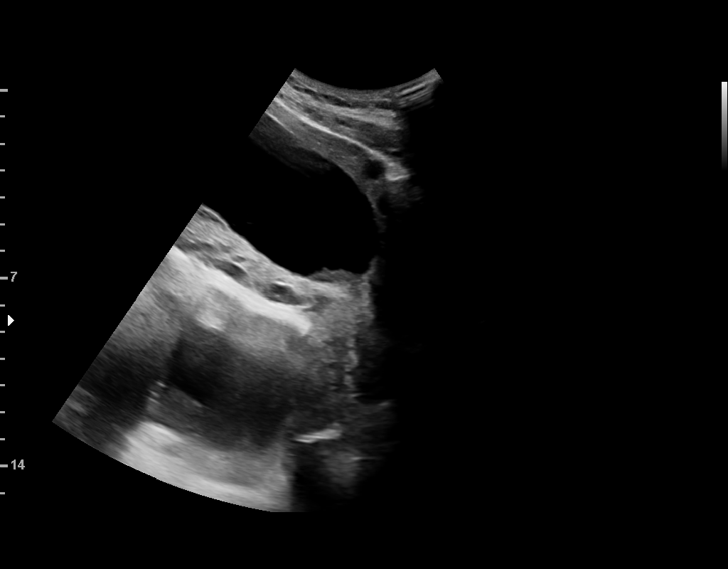
[im 120/120]
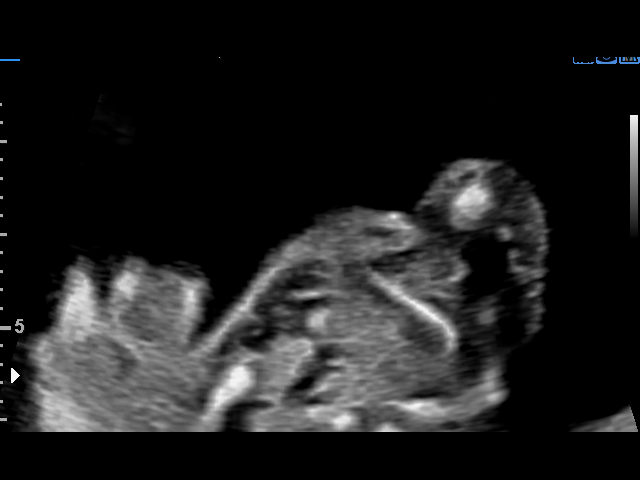

[14 of 28 positions shown; findings below may reference images not displayed]

1  US MFM OB COMP + 14 WK               76805.01     LUK MUI RAPANUT
 ----------------------------------------------------------------------

 ----------------------------------------------------------------------
Indications

  Encounter for antenatal screening for
  malformations
  19 weeks gestation of pregnancy
 ----------------------------------------------------------------------
Fetal Evaluation

 Num Of Fetuses:         1
 Fetal Heart Rate(bpm):  149
 Cardiac Activity:       Observed
 Presentation:           Transverse, head to maternal left
 Placenta:               Posterior Fundal
 P. Cord Insertion:      Visualized

 Amniotic Fluid
 AFI FV:      Within normal limits

                             Largest Pocket(cm)

Biometry

 BPD:      41.9  mm     G. Age:  18w 5d         37  %    CI:        72.92   %    70 - 86
                                                         FL/HC:      16.9   %    16.1 -
 HC:       156   mm     G. Age:  18w 4d         22  %    HC/AC:      1.13        1.09 -
 AC:      138.6  mm     G. Age:  19w 2d         55  %    FL/BPD:     63.0   %
 FL:       26.4  mm     G. Age:  18w 0d         14  %    FL/AC:      19.0   %    20 - 24
 HUM:      25.5  mm     G. Age:  18w 0d         22  %
 CER:      19.4  mm     G. Age:  18w 5d         41  %
 NFT:       3.1  mm
 CM:        3.4  mm
 Est. FW:     252  gm      0 lb 9 oz     38  %
OB History

 Gravidity:    2         Term:   0        Prem:   0        SAB:   1
 TOP:          0       Ectopic:  0        Living: 0
Gestational Age

 LMP:           19w 0d        Date:  12/27/17                 EDD:   10/03/18
 U/S Today:     18w 5d                                        EDD:   10/05/18
 Best:          19w 0d     Det. By:  LMP  (12/27/17)          EDD:   10/03/18
Anatomy

 Cranium:               Appears normal         LVOT:                   Appears normal
 Cavum:                 Appears normal         Aortic Arch:            Appears normal
 Ventricles:            Appears normal         Ductal Arch:            Appears normal
 Choroid Plexus:        Appears normal         Diaphragm:              Appears normal
 Cerebellum:            Appears normal         Stomach:                Appears normal, left
                                                                       sided
 Posterior Fossa:       Appears normal         Abdomen:                Appears normal
 Nuchal Fold:           Appears normal         Abdominal Wall:         Appears nml (cord
                                                                       insert, abd wall)
 Face:                  Appears normal         Cord Vessels:           Appears normal (3
                        (orbits and profile)                           vessel cord)
 Lips:                  Appears normal         Kidneys:                Appear normal
 Palate:                Appears normal         Bladder:                Appears normal
 Thoracic:              Appears normal         Spine:                  Appears normal
 Heart:                 Appears normal         Upper Extremities:      Appears normal
                        (4CH, axis, and
                        situs)
 RVOT:                  Appears normal         Lower Extremities:      Appears normal

 Other:  Heels and 5th digit visualized. Nasal bone visualized.
Cervix Uterus Adnexa

 Cervix
 Length:            3.5  cm.
 Normal appearance by transabdominal scan.

 Uterus
 No abnormality visualized.

 Left Ovary
 Within normal limits.

 Right Ovary
 Within normal limits.

 Cul De Sac
 No free fluid seen.

 Adnexa
 No abnormality visualized.
Impression

 Normal interval growth.  No ultrasonic evidence of structural
 fetal anomalies.
Recommendations

 Follow up as clinically indicated.

## 2023-05-22 ENCOUNTER — Ambulatory Visit (INDEPENDENT_AMBULATORY_CARE_PROVIDER_SITE_OTHER): Admitting: Obstetrics and Gynecology

## 2023-05-22 ENCOUNTER — Encounter: Payer: Self-pay | Admitting: Obstetrics and Gynecology

## 2023-05-22 VITALS — BP 104/62 | HR 78 | Ht 65.0 in | Wt 209.0 lb

## 2023-05-22 DIAGNOSIS — M5432 Sciatica, left side: Secondary | ICD-10-CM

## 2023-05-22 DIAGNOSIS — D259 Leiomyoma of uterus, unspecified: Secondary | ICD-10-CM

## 2023-05-22 NOTE — Progress Notes (Signed)
 33 y.o. G68P1011 female here for consultation for fibroids- second opinion. Single. Was in the [employer], left 2015. Single. School for [employer].  Patient's last menstrual period was 05/20/2023 (approximate). Period Duration (Days): 5 Period Pattern: Regular Menstrual Flow: Moderate Menstrual Control: Tampon, Maxi pad Dysmenorrhea: (!) Moderate Dysmenorrhea Symptoms: Nausea, Diarrhea  ~11/2022 Had CT at urgent care for LLQ pain that showed fibroids. Unimproved with ibuprofen. Pain in LLQ that radiates to lower back and to leg. Pain happens most days of the week, rated a 6/10, last hours. Pain for ~6 months, worsening over time. Unchanged with periods, BM or urination. No N/V. Better with standing.  F/u US  with maybe 1 fibroid that was ~2cm. Reviewed by GYN at [facility]. Patient wanted to get a second opinion. No records available for me to review.  Orange theory 3-4x/wk Sexually active: ocassionally   GYN HISTORY: Prior D&C 2018  OB History  Gravida Para Term Preterm AB Living  3 1 1  1 1   SAB IAB Ectopic Multiple Live Births      1    # Outcome Date GA Lbr Len/2nd Weight Sex Type Anes PTL Lv  3 AB 05/16/16          2 Term      Vag-Spont   LIV  1 Gravida             Past Medical History:  Diagnosis Date   Bipolar 2 disorder (HCC)    missed diagnosis pt states should be PTSD   Depression    no meds, doing good   GERD (gastroesophageal reflux disease)    HSV infection    Infection    UTI    Past Surgical History:  Procedure Laterality Date   BUNIONECTOMY Right 10/13    Current Outpatient Medications on File Prior to Visit  Medication Sig Dispense Refill   famotidine (PEPCID) 40 MG tablet      MAGNESIUM PO Take by mouth.     omeprazole (PRILOSEC) 20 MG capsule Take 20 mg by mouth daily.     ondansetron  (ZOFRAN  ODT) 4 MG disintegrating tablet 4mg  ODT q6 hours prn nausea/vomit 10 tablet 0   Prenatal Vit-Fe Fumarate-FA (PRENATAL MULTIVITAMIN) TABS tablet Take 1 tablet by mouth  daily at 12 noon.     valACYclovir (VALTREX) 500 MG tablet Take 500 mg by mouth 2 (two) times daily.     venlafaxine XR (EFFEXOR-XR) 150 MG 24 hr capsule Take 150 mg by mouth.     No current facility-administered medications on file prior to visit.    Allergies  Allergen Reactions   Zoloft [Sertraline Hcl]     tremors       PE Today's Vitals   05/22/23 1107  BP: 104/62  Pulse: 78  SpO2: 97%  Weight: 209 lb (94.8 kg)  Height: 5\' 5"  (1.651 m)   Body mass index is 34.78 kg/m.  Physical Exam Vitals reviewed. Exam conducted with a chaperone present.  Constitutional:      General: She is not in acute distress.    Appearance: Normal appearance.  HENT:     Head: Normocephalic and atraumatic.     Nose: Nose normal.  Eyes:     Extraocular Movements: Extraocular movements intact.     Conjunctiva/sclera: Conjunctivae normal.  Pulmonary:     Effort: Pulmonary effort is normal.  Genitourinary:    General: Normal vulva.     Exam position: Lithotomy position.     Vagina: Normal. No vaginal discharge.  Cervix: Normal. No cervical motion tenderness, discharge or lesion.     Uterus: Normal. Not enlarged and not tender.      Adnexa: Right adnexa normal and left adnexa normal.  Musculoskeletal:        General: Normal range of motion.     Cervical back: Normal range of motion.  Neurological:     General: No focal deficit present.     Mental Status: She is alert.  Psychiatric:        Mood and Affect: Mood normal.        Behavior: Behavior normal.       Assessment and Plan:        Uterine leiomyoma, unspecified location Assessment & Plan: Patients presents for second opinion for fibroids in setting of left pelvic and left back pain Patient reports she had both a pelvic CT and pelvic ultrasound over the past 6 months, however I have no records to review today Normal pelvic exam Patient to complete records release, and I will follow-up once records have been  obtained.   Sciatica of left side   Described pain is most consistent with sciatica.  Conservative therapy including NSAIDs, ice, heat, stretching reviewed with patient. Follow-up with PCP  Romaine Closs, MD

## 2023-05-28 ENCOUNTER — Encounter: Payer: Self-pay | Admitting: Obstetrics and Gynecology

## 2023-05-28 DIAGNOSIS — D259 Leiomyoma of uterus, unspecified: Secondary | ICD-10-CM | POA: Insufficient documentation

## 2023-05-28 NOTE — Assessment & Plan Note (Addendum)
 Patients presents for second opinion for fibroids in setting of left pelvic and left back pain Patient reports she had both a pelvic CT and pelvic ultrasound over the past 6 months, however I have no records to review today Normal pelvic exam Patient to complete records release, and I will follow-up once records have been obtained.

## 2023-05-29 NOTE — Addendum Note (Signed)
 Addended by: Cleone Dad V on: 05/29/2023 04:09 PM   Modules accepted: Level of Service

## 2023-08-01 ENCOUNTER — Other Ambulatory Visit: Payer: Self-pay | Admitting: Medical Genetics

## 2023-10-26 ENCOUNTER — Other Ambulatory Visit: Payer: Self-pay | Admitting: Medical Genetics

## 2023-10-26 ENCOUNTER — Encounter: Payer: Self-pay | Admitting: *Deleted

## 2023-10-26 DIAGNOSIS — Z006 Encounter for examination for normal comparison and control in clinical research program: Secondary | ICD-10-CM

## 2023-11-01 ENCOUNTER — Ambulatory Visit

## 2023-11-01 DIAGNOSIS — Z23 Encounter for immunization: Secondary | ICD-10-CM
